# Patient Record
Sex: Male | Born: 1985 | Race: White | Hispanic: No | Marital: Married | Smoking: Never smoker
Health system: Southern US, Community
[De-identification: ages and names within clinical notes are randomized; demographics above are authoritative.]

## PROBLEM LIST (undated history)

## (undated) DIAGNOSIS — K760 Fatty (change of) liver, not elsewhere classified: Secondary | ICD-10-CM

## (undated) DIAGNOSIS — J302 Other seasonal allergic rhinitis: Secondary | ICD-10-CM

## (undated) DIAGNOSIS — J45909 Unspecified asthma, uncomplicated: Secondary | ICD-10-CM

## (undated) HISTORY — DX: Fatty (change of) liver, not elsewhere classified: K76.0

## (undated) HISTORY — DX: Unspecified asthma, uncomplicated: J45.909

---

## 2015-01-27 ENCOUNTER — Encounter: Payer: Self-pay | Admitting: Optometry

## 2015-01-27 ENCOUNTER — Ambulatory Visit: Payer: Self-pay | Admitting: Optometry

## 2015-01-27 DIAGNOSIS — H5213 Myopia, bilateral: Secondary | ICD-10-CM | POA: Insufficient documentation

## 2015-01-27 DIAGNOSIS — H16403 Unspecified corneal neovascularization, bilateral: Secondary | ICD-10-CM | POA: Insufficient documentation

## 2015-01-27 NOTE — Progress Notes (Addendum)
Outpatient Visit      Patient name: Ricky Ellison  DOB: 09/08/1986       Age: 29 y.o.  MR#: [id number]    Encounter Date: 01/27/2015    Subjective:      Chief Complaint   Patient presents with    Blurred Vision     DV NV      HPI     Blurred Vision    Additional comments: DV NV            Comments   Ricky Ellison is a 29 y.o. male present today for NP annual contact lens   exam. Pt last exam 1 yr ago, also interested in eyeglass RX  Pt currently wears Acuvue Oasys but when lenses examined they are marked   ciba (unsure of exact  lens).  -dryness, -redness, -itching, -burning, -tearing, -photophobia, -diplopia,   -headaches, -pain, -flashes, -floaters              has a current medication list which includes the following prescription(s): albuterol.     has No Known Allergies (drug, envir, food or latex).      No past medical history on file.   No past surgical history on file.     Specialty Problems        Ophthalmology Problems    Corneal neovascularization of both eyes        Myopia, bilateral               ROS     Positive for: Eyes         Objective:     Base Eye Exam     Visual Acuity (Snellen - Linear)      Right Left   Dist cc 20/20 20/20   Near cc J1+ OU         Correction:  Contacts      Pupils      Pupils APD   Right PERRLA None   Left PERRLA None         Visual Fields (Counting fingers)      Left Right   Result Full Full         Extraocular Movement      Right Left   Result Full Full         Neuro/Psych     Oriented x3:  Yes    Mood/Affect:  Normal            Additional Tests     Amsler      Right Left   Amsler Normal Normal               Slit Lamp and Fundus Exam     External Exam      Right Left    External Normal ocular adnexae, lacrimal gland & drainage, orbits Normal ocular adnexae, lacrimal gland & drainage, orbits      Slit Lamp Exam      Right Left    Lids/Lashes Normal structure & position Normal structure & position    Conjunctiva/Sclera Normal bulbar/palpebral, conjunctiva, sclera Normal  bulbar/palpebral, conjunctiva, sclera    Cornea 3mm neo 360 2mm neo 360    Anterior Chamber Clear & deep Clear & deep    Iris Normal shape, size, morphology Normal shape, size, morphology    Lens Normal cortex, nucleus, anterior/posterior capsule, clarity Normal cortex, nucleus, anterior/posterior capsule, clarity    Vitreous Clear Clear      Fundus Exam  Right Left    Disc Normal size, appearance, nerve fiber layer Normal size, appearance, nerve fiber layer    C/D Ratio 0.35 0.35    Macula Normal Normal    Vessels Normal Normal            Refraction     Manifest Refraction      Sphere Cylinder   Right -2.25 ds   Left -2.25 ds         Final Rx      Sphere Cylinder   Right -2.25 ds   Left -2.25 ds       Type:  SVL    Expiration Date:  01/28/2016            Contact Lens Exam     Current Contact Lens Rx (Trial Lens, Dispensed)      Brand Base Curve Sphere   Right [place]: Acuvue Oasys 8.40 -2.25   Left [place]: Acuvue Oasys 8.40 -2.25         Current Contact Lens Rx #2      Brand Base Curve Sphere   Right Unsure of pt's own brand, Ciba lens x6.50 marking     Left Unsure of pt's own brand, Ciba lens x6.50 marking           Contact History     AWT:  13    WTT:  6    Replacement Frequency:  Monthly    Care Regimen:  Normal      Final Contact Lens Rx      Brand Base Curve Sphere   Right [place]: Acuvue Oasys 8.40 -2.25   Left [place]: Acuvue Oasys 8.40 -2.25                 Final Rx      Sphere Cylinder   Right -2.25 ds   Left -2.25 ds       Type:  SVL    Expiration Date:  01/28/2016        Final Contact Lens Rx      Brand Base Curve Sphere   Right [place]: Acuvue Oasys 8.40 -2.25   Left [place]: Acuvue Oasys 8.40 -2.25                 No annotated images are attached to the encounter.      Assessment/Plan:      1. Myopia, bilateral     2. Corneal neovascularization of both eyes           PLAN:  1.  New spectacle Rx given today for full time wear.  Monitor yearly or sooner PRN if visual problems noted.       Successful soft CL re-fit today with Acuvue Oasys 8.4/-2.25 OD and OS.  Good fit, comfort, movement and vision.  CL trials dispensed.  Encouraged biweekly replacement and to remove nightly before bed and to avoid extended wear due to corneal neo and h/o sleeping in lenses and overwear with previous lens.  Pt to call/rtc if changes noted or will call to finalize this rx vs going back to previous lens material which patient will call to inform us of.   Undilated ocular health exam was within normal limits OU today.  The patient deferred dilation and was educated on potential ocular issues not addressed on undilated exam (peripheral retinal disease, holes, tears etc.) and to call/RTC asap if notes any flashes, new floaters or visual changes.  Otherwise monitor in 1-2 years with dilated exam.  Patient called 05/19/15 and wants to order his original lenses which were Air Optix Aqua -2.25 OU.  Will finalize rx today.  Monitor yearly.

## 2015-01-29 ENCOUNTER — Encounter: Payer: Self-pay | Admitting: Primary Care

## 2015-01-29 ENCOUNTER — Ambulatory Visit: Payer: Self-pay | Admitting: Primary Care

## 2015-01-29 VITALS — BP 106/66 | HR 84 | Ht 75.0 in | Wt 282.2 lb

## 2015-01-29 DIAGNOSIS — H6122 Impacted cerumen, left ear: Secondary | ICD-10-CM

## 2015-01-29 DIAGNOSIS — J45909 Unspecified asthma, uncomplicated: Secondary | ICD-10-CM | POA: Insufficient documentation

## 2015-01-29 DIAGNOSIS — K760 Fatty (change of) liver, not elsewhere classified: Secondary | ICD-10-CM

## 2015-01-29 DIAGNOSIS — Z23 Encounter for immunization: Secondary | ICD-10-CM

## 2015-01-29 DIAGNOSIS — T783XXA Angioneurotic edema, initial encounter: Secondary | ICD-10-CM

## 2015-01-29 DIAGNOSIS — R040 Epistaxis: Secondary | ICD-10-CM

## 2015-01-29 NOTE — Progress Notes (Signed)
SUBJECTIVE  Patient here to establish care  Prior provider was: Ricky Ellison [place]    Last CPE: 1 year  Last Flu Shot:  September 2015  HIV Testing: declined      1.  States frequent nosebleeds in the past that needed to be cauterized.  He had this done in [place], but he just started getting the nosebleeds again, last one 2 weeks ago.  States typically a seasonal thing for him.    2.  Going away to [place] and recommendations are for a Hep A vaccine.  He also states he need a Tdap.    3.  Left ear discomfort:  He wonders if he has an infection that is brewing.  States that the ear pain started 3 days ago, however today it is feeling a little better.  Has not taken anything for it. States past infections.    4.  Lip swelling last summer a couple of time cause unknown patient would like to see allergist.    5. Fatty Liver:  Patient states that his liver enzymes in past were a bit elevated and he has U/S done showing fatty liver or signs of it.    6.  Asthma:  Exercise induced, well controlled with inhaler and nebs prn.      Patient Active Problem List   Diagnosis Code    Myopia, bilateral H52.13    Corneal neovascularization of both eyes H16.403    Asthma J45.909    Fatty liver K76.0      Past Medical History   Diagnosis Date    Asthma      exercise    Fatty liver      Past Surgical History   Procedure Laterality Date    Wisdom tooth extraction       Family History   Problem Relation Age of Onset    Diabetes Mother     Diabetes Father     Hypertension Father     Diabetes Paternal Grandmother     Heart attack Paternal Grandmother     Colon cancer Paternal Grandfather     Prostate cancer Maternal Grandfather     Diabetes Maternal Grandmother     Breast cancer Maternal Grandmother     Cancer Maternal Grandmother      lymphoma    No Known Problems Brother      History     Social History    Marital Status: Married     Spouse Name: N/A     Number of Children: N/A    Years of Education: N/A          Occupational History    Not on file.     Social History Main Topics    Smoking status: Never Smoker     Smokeless tobacco: Never Used    Alcohol Use: 0.0 oz/week     0 Not specified per week      Comment: occasional    Drug Use: No    Sexual Activity:     Partners: Female     Copy: Condom     Other Topics Concern    Not on file     Social History Narrative    Lives with:  Lives with wife    Lives in:  [place]    Origin/immigration: [employer] [place]    Hobbies: hiking, craft breweries    Exercise: 6 days a week goes to gym    Diet: no special diet    Travel:  no    Occupation:  Designer, industrial/productLab Tech at [employer]    Years of Education: BA         No Known Allergies (drug, envir, food or latex)    Review of Systems   (Positive items noted in bold, otherwise listed items are negative)  General: fatigue, weight change, loss of appetite, fever, night sweats, weakness  ENT: hearing difficulty, ear ringing or pain, sinus problems, nasal congestion, nosebleeds, oral lesions, dental problems, throat pain  CV: irregular heartbeat, racing heart, chest pain, leg swelling, leg pain with walking, DOE  Resp: shortness of breath, prolonged or productive cough, wheezing, pleuritic pain  GI: dysphagia, heartburn, nausea, vomiting, constipation, diarrhea, abdominal pain, blood in stool, melena, changes in bowel habits  GU: dysuria, urinary frequency or urgency, urinary incontinence, nocturia, impotence  MSK: joint pain, joint swelling, myalgias, muscle weakness  Derm: rash, itching, new or changing skin lesions, hair loss or change  Neuro: headache, vision changes, numbness, parasthesias, balance or gait difficulty, dizziness, tremor, uncontrolled movements  Psych: insomnia, irritability, depression, anxiety, mood swings, frequent anger, hallucinations  Endocrine: heat or cold intolerance, polydipsia, libido changes  Heme: easy bruising or bleeding, unexplained swelling  Allergy and Immunology: itchy or watery eyes or  nose, frequent infections Angioedema    Current Outpatient Prescriptions   Medication Sig    albuterol HFA 108 (90 BASE) MCG/ACT inhaler Inhale 2 puffs into the lungs every 6 hours as needed for Wheezing   Shake well before each use.    albuterol (PROVENTIL) (5 MG/ML) 0.5% nebulizer solution Take 2.5 mg by nebulization every 6 hours as needed for Wheezing     OBJECTIVE  BP 106/66 mmHg   Pulse 84   Ht 1.905 m (6\' 3" )   Wt 128.005 kg (282 lb 3.2 oz)   BMI 35.27 kg/m2  General Appearance:    Alert, cooperative, no distress, appears stated age   Head:    Normocephalic   Ears:    Right ear normal some cerumen in ear but not impacted.  Left ear + impaction noted cannot visualize the TM.  After flush of ear the TM is clear no infection.   Oropharynx:   Buccal mucosa normal; good dentition; oropharynx clear       Lungs:     Clear to auscultation bilaterally without W/R/R, unlabored respiratory effort    Heart:    Regular rate and rhythm, S1 and S2 normal, no murmur, rub or gallop                       Neurologic:   A&O x 3, normal gait   Psychiatric:   Normal mood and affect     ASSESSMENT & PLAN  1. Asthma, unspecified asthma severity, uncomplicated  -continue albuterol and nebs PRN    2. Fatty liver  Hepatic functions    3. Frequent nosebleeds  ORDERED  - AMB REFERRAL TO ENT    4. Angioedema, initial encounter  ORDERED  - AMB REFERRAL TO ALLERGY/IMMUNOLOGY    5. Cerumen impaction, left  Ear flush today.    6. Vaccines given today:  Hep A and Tdap     Return for Yearly Check-up sometime within the year Dr. Michael Ellison. Return to office sooner prn.      Ricky Ellison, [place]

## 2015-02-09 ENCOUNTER — Telehealth: Payer: Self-pay | Admitting: Pediatric Allergy

## 2015-02-09 NOTE — Telephone Encounter (Signed)
Mr. Ricky Ellison is scheduled for an NPV on:    Date: 03/08/2015  Provider: Dr. Christia ReadingSlack  Symptoms: angioedema, several allergic reactions without known cause    This appointment is over 2 weeks out.  Does the patient want a sooner appointment if available? yes  Is the patient ok with waiting until the date scheduled to be seen? yes    The patient can be reached at [phone] if necessary.

## 2015-02-16 LAB — LIPID PANEL
Cholesterol: 105
Glucose: 88
HDL: 37
LDL Calculated: 54
Non HDL Cholesterol: 68
Triglycerides: 72

## 2015-02-17 ENCOUNTER — Encounter: Payer: Self-pay | Admitting: Gastroenterology

## 2015-02-18 ENCOUNTER — Encounter: Payer: Self-pay | Admitting: Primary Care

## 2015-03-03 ENCOUNTER — Encounter: Payer: Self-pay | Admitting: Gastroenterology

## 2015-03-08 ENCOUNTER — Ambulatory Visit: Payer: Self-pay | Admitting: Pediatric Allergy

## 2015-03-08 ENCOUNTER — Ambulatory Visit: Payer: Self-pay

## 2015-03-08 ENCOUNTER — Encounter: Payer: Self-pay | Admitting: Pediatric Allergy

## 2015-03-08 VITALS — BP 121/59 | HR 72 | Ht 76.0 in | Wt 272.0 lb

## 2015-03-08 DIAGNOSIS — L509 Urticaria, unspecified: Secondary | ICD-10-CM

## 2015-03-08 DIAGNOSIS — J301 Allergic rhinitis due to pollen: Secondary | ICD-10-CM

## 2015-03-08 DIAGNOSIS — T783XXA Angioneurotic edema, initial encounter: Secondary | ICD-10-CM

## 2015-03-08 DIAGNOSIS — J4520 Mild intermittent asthma, uncomplicated: Secondary | ICD-10-CM

## 2015-03-08 MED ORDER — TRIAMCINOLONE ACETONIDE 55 MCG/ACT NA AERO *I*
2.0000 | INHALATION_SPRAY | Freq: Every day | NASAL | Status: DC
Start: 2015-03-08 — End: 2015-07-14

## 2015-03-08 NOTE — Patient Instructions (Addendum)
1. Start Nasacort 2 sprays on each side once daily with the technique reviewed  2. Restart Zyrtec once daily, 1-2 times daily as needed. Use this daily during the pollen seasons  3. Consider allergy shots  4. keep auto-injectable epinephrine readily available and use as needed for severe allergic reaction.  5. If you have another episode of swelling    Follow up in 4-5 months    1.  Environmental Control or modification:  Dust mite control.         1.  Dust mite proof covers/encasing on the mattress, pillow and box spring.         2.  Wash sheets and bed linens in hot water each week.       3.  Vacuum carpets in bedroom each week.       4.  Dust or wipe down any hard surfaces.

## 2015-03-08 NOTE — Progress Notes (Signed)
Thank you for referring Ricky Ellison to me for evaluation. Please see my note below for details of our visit.    As you know Ricky Ellison is a 29 y.o. male who has a  has a past medical history of Asthma and Fatty liver.Marland Kitchen He presents today for evaluation of allergic reaction, nasal drainage, asthma..    Chief Complaint   Patient presents with    Angioedema    Allergic Rhinitis        HPI:  Ricky Ellison that last summer2015 he had an allergic reaction. He note that he was sitting outside at a brewery. He started having itching under his arms and felt flush. He had watery, red eyes. He had tongue swelling and mouth swelling. He complained of a rash that was all over on his chest and arms. No new exposures. No illnesses around that time. No NSAID exposure as far as he knows. He was given benadryl and another Histamine blocker in the ED. He was monitored for a couple hours and then he was discharged. It took about 2 hours for his symptoms to resolve. He has had hives previously but no swelling. He has allergy to Deoderants and laundry detergents so only uses the brand he does not have symptoms with. No swelling of the airway with these episode.    This summer again he had a reaction and got ahead of his symptoms and took 4 benadryl and stopped the reaction. He Ellison that he was sitting on the back porch. He had itching and watery eyes. He went inside. He felt that his lips were swelling and he took some benadryl. It took about 1-2 hours for symptoms to resolve. He does not recall having hives at the time. He Ellison that there were no airway symptoms. No vomiting with these episodes. He does not recall a trigger to these episodes. No similar foods for either episode. He eats all foods that he has eaten other times. No joint symptoms. No intolerance to temperature changes. No family history of thyroid issues or swelling.     He Ellison that he has started having post nasal drainage on the left nostril dripping in the  front. He gets nose bleeds frequently. This is worse in the spring months. His symptoms are present year round as well. He Ellison itchy dry throat in the summer months. He does take Zyrtec once daily during the pollen season. This helps at times, no sedation. He has not tried any nose sprays. Never had allergy testing previously or allergy shots.     He feels that his asthma is well controlled.   He uses a rescue inhaler for his asthma.   EIB is his main symptom.   He does pretreat prior to exercise.   He has not previously been on a maintenance inhaler in the past.   He Ellison that he has been on Advair for 1 month about 1.5 yrs ago.  He has never been hospitalized for his breathing  Every once in a while he has symptoms with pollen seasons and the warmer weather.    He denies any symptoms recently.    He does have breathing symptoms (wheezing) with uri's.  His cough does tend to last more than 7 days.  Being around smoke triggers symptoms.    Patient's medications, allergies, past medical, surgical, social and family histories were reviewed and updated as appropriate.    There is no family or personal history of asthma, allergies, immune deficiency, cystic  fibrosis, or recurrent infections.    Social History  He  reports that he has never smoked. He has never used smokeless tobacco. He reports that he drinks alcohol. He reports that he does not use illicit drugs.     Environmental History  He lives in a apartment.    Pets in the house: dog.   There is not a basement in the home.   There is not mold or moisture in the home.  The bedroom has carpet as flooring.    There are not dust mite allergen covers on the bed.    There are any down or feather pillows, comforters, etc.    The home has central ac, forced air heating.   There is not cigarette exposure in the home.    She does not note triggers of non-allergic rhinitis including: smoke, perfumes, strong smells, cold air, aerosols, solvents, house hold cleansers.    There is not history of systemic allergic reaction to insect bite  Occupation: he  works as a Research scientist (medical), Landscape architect.    Review of Systems   Constitutional: Negative for chills and fever.   HENT: Negative for nosebleeds and sore throat.    Eyes: Negative for blurred vision and double vision.   Respiratory: Negative for cough, shortness of breath and wheezing.    Cardiovascular: Negative for chest pain, palpitations and leg swelling.   Gastrointestinal: Negative for abdominal pain, blood in stool, diarrhea, nausea and vomiting.   Musculoskeletal: Negative for joint pain and myalgias.   Neurological: Negative for dizziness, focal weakness, weakness and headaches.   Endo/Heme/Allergies: Does not bruise/bleed easily.         Physical Exam   Constitutional: He is oriented to person, place, and time and well-developed, well-nourished, and in no distress. No distress.   HENT:   Head: Normocephalic and atraumatic.   Right Ear: Tympanic membrane, external ear and ear canal normal. No drainage or tenderness. Tympanic membrane is not injected, not scarred, not perforated, not erythematous and not retracted.   Left Ear: Tympanic membrane, external ear and ear canal normal. No drainage or tenderness. Tympanic membrane is not injected, not scarred, not perforated, not erythematous and not retracted.   Nose: Mucosal edema (pale boggy nasal mucosa without obstruction or nasal polyps) and rhinorrhea (clear) present.   Mouth/Throat: Uvula is midline, oropharynx is clear and moist and mucous membranes are normal.   Eyes: Conjunctivae and EOM are normal. Pupils are equal, round, and reactive to light.   Neck: Normal range of motion. Neck supple.   Cardiovascular: Normal rate and regular rhythm.    No murmur heard.  Pulmonary/Chest: Effort normal and breath sounds normal. He has no wheezes. He has no rales.   Abdominal: Soft. Bowel sounds are normal.   Musculoskeletal: Normal range of motion. He exhibits no edema.    Neurological: He is alert and oriented to person, place, and time.   Skin: Skin is warm and dry. No rash noted. He is not diaphoretic. No erythema.   Psychiatric: Affect normal.   Vitals reviewed.      Allergy Testing 03/09/2015   Type of testing preformed: Percutaneous   (+)   Histamine     10mg /ml     HS 5/10   (-)   0.9% NaCl/   0.03%Alb/   0.4% Phenol 0/0   Cat      10,000BAU     HS 0/0   Dog     1:20  HS 0/0   Mix Feathers     1:10    HS 0/0   Mouse    1:20    G 0/0   D.f. Mite     10,000AU     G 6/10   D.p. Mite     10,000AU     G 5/10   Roach     1:20     G 0/3   Pine, white/Eastern     1:20     G 0/0   Rolette, Amer.     1:20     HS 0/0   Poplar, cottonwood     1:20     G 8/15   Sugar Maple     1:20     G 7/7   Oak, white     1:20     G 6/22   Ash, white     1:20     G 8/24   Cedar, red     1:20     G 2/3   Moab, black     1:20     G 5/8   Charletta Cousin, red/river     1:20     G 8/23   Hickory, shagbark     1:20     G /25   Black Walnut     1:20     G 7/16   Sycamore     1:20     G 7/19   Grass Mix # 5     100,000BAU     G 8/20   Johnson Grass     1:20     G 7/8   Sheep Sorrel     1:20     G 10/12   Plantain     1:20     G 8/13   Smooth Brome     1:20     G 9/18   Yellow Dock     1:20     G 5/16   Amaranths     1:20     G 6/17   Artemesia     1:20     G 6/25   Chenopodium     1:20     G 5/20   Cocklebur     1:20     G 4/16   Kochia     1:20     G 9/17   Ragweed     1:20     G 7/18   Alternaria Alternata   1:10    HS 0/0   Bipolaris Sorokiniana   1:10    HS 0/0   Hormodendrum Cladospor.   1:10    HS 0/0   Aspergillus Mix   1:10    HS 0/0   Penicillium Mix   1:10    HS 0/0     Asthma ACT Scores - Review 03/08/2015   In the past 4 weeks, how much of the time did your asthma keep you from getting as much done at work, school, or home? 5-None of the time   During the past 4 weeks, how often have you had shortness of breath? 3-3 to 6 times a week   During the past 4 weeks, how often did your asthma symptoms wake you up at  night or earlier than usual in the morning? 5-not at all   During the past 4 weeks, how often have you used your rescue inhaler or nebulizer medication(such as albuterol)? 3-2 or 3  times per week   How would you rate your asthma control during the past 4 weeks? 4-Well controlled   ACT SCORE 12 and OLDER 20     Assessment and Plan:   1. Asthma, mild intermittent, uncomplicated  PULMONARY FUNCTION TEST Spirometry with Bronchodilator, Exhaled Nitric Oxide   2. Allergic rhinitis due to pollen     3. Angioedema, initial encounter           1. Angioedema and urticaria:  Ricky Ellison acute onset of symptoms of angioedema and urticaria while sitting outside along with his positive testing to her mental allergens is suggestive that she may have had a heightened response to the pollen.  As he has sensitization to multiple allergens we discussed allergen immunotherapy as a good option for him.  We discussed that another consideration would be idiopathic urticaria and angioedema. Urticaria and Angioedema/swelling can be caused by a variety of factors, and is most commonly the result of mast cell-mediated processes and both are most comonly Idiopathic in origin. Medications are common causes of urticaria and angioedema, therefore asked him to limit his use of NSAIDs.  Viral infections or stress can also trigger episodes.  Ricky Ellison does not have any symptoms suggestive of autoimmune disease or thyroid disease of the underlying cause of his symptoms.  If his symptoms do recur would be reasonable to obtain a baseline CBC and tryptase level but as he has had these 2 isolated episodes with similar characteristics we will hold off at this time.  I've instructed that if he should have recurrence he would benefit from starting aggressive treatment with antihistamines (twice daily) and if symptoms are not able to be well controlled the addition of singulair can be considered. As the swelling has involved the airway I recommended that he keep  auto-injectable epinephrine readily available and use as needed for severe allergic reaction.     2.  Asthma mild intermittent: Currently been symptoms are well controlled with as needed albuterol and pretreatment prior to exercise.  I have encouraged him to continue this regimen and we will obtain baseline lung function testing upon follow-up in our clinic.     3. Allergic rhinitis: Ricky Ellison was found to have significant sensitivity to trees, grasses, weeds, dust mite on his aeroallergen skin prick testing today.  We discussed treatments for allergic rhinitis to include avoidance, medications, and allergen immunotherapy.  We discussed interventions for dust mite control, and he was provided with information on dust mite avoidance measures.  It would be reasonable to start with medication therapy to include intranasal steroid (Nasacort 2 sprays on each side once daily) and oral antihistamine  once daily. We reviewed proper technique for use of a nose spray.  If he does not achieve adequate control with medications and/or would like to decrease overall medication use, allergen immunotherapy would be an option to consider.       Recommendations:  Orders Placed This Encounter   Procedures    PULMONARY FUNCTION TEST Spirometry with Bronchodilator, Exhaled Nitric Oxide       Ricky Ellison Ellison [facility] in 4-5 months for follow-up evaluation.      Mr. Ricky Ellison has received take home instructions and/or educational material today.      My apologies for the delay in cine my note.Thank you for the opportunity to participate in the care of your delightful patient,  Ricky Ellison.  Should there be any questions or concerns,  please do not hesitate to contact me.    Sincerely,    Antony Salmon. Christia Reading, MD, MS  Assistant Professor of Medicine and Pediatrics  Allergy and Immunology  Division of Allergy, Immunology, and Rheumatology  [facility] of  [facility] and [facility]

## 2015-03-10 ENCOUNTER — Encounter: Payer: Self-pay | Admitting: Otolaryngology

## 2015-03-10 ENCOUNTER — Ambulatory Visit: Payer: Self-pay | Admitting: Otolaryngology

## 2015-03-10 VITALS — BP 118/59 | HR 64 | Ht 76.0 in | Wt 269.0 lb

## 2015-03-10 DIAGNOSIS — R040 Epistaxis: Secondary | ICD-10-CM | POA: Insufficient documentation

## 2015-03-10 NOTE — Procedures (Signed)
PROCEDURE  Procedure name: Silver Nitrate Cautery  Indication: right sided recurrent epistaxis  Consent: I obtained consent from the patient prior to performing the procedure.  Pre-procedure: He was seated for the procedure. His right nostril mucosa was anaesthetized with tetracaine and phenylephrine with appropriate technique. After adequate anaesthesia I began the procedure.  Procedure: I used a cotton swab infused with silver nitrate to cauterize the prominent vasculature using appropriate technique. I applied a small amount of topical antibiotic to the small ulceration left after the cautery procedure. I instructed him to keep the ulceration moist with water-based lubricant jelly.  Post-procedure: The patient has no complaints post-procedure. He will follow up in as needed.       Attestations:  Documented by Jackie Plumanielle Somalia Segler acting as a scribe for Dr. Zenovia JarredJohn Wayman 03/10/2015 8:12 AM    All medical record entries made by the Scribe were at my direction and personally dictated by me. I have reviewed the chart and agree that the record accurately reflects my personal performance of the history, physical exam, assessment and plan. I have also personally directed, reviewed, and agree with the discharge instructions. 03/10/2015 8:12 AM

## 2015-03-10 NOTE — Progress Notes (Signed)
Patient seen on 03/10/2015 at 8:05 AM     HPI  CC: nosebleeds  Treatment to date: right cauterization    Context: Ricky BeechBenjamin Ellison is a 29 y.o. male who presents for new evaluation of nosebleeds on and off for many years. He reports that they occur mostly in the spring and fall which is when he has seasonal allergies. A little over a year ago he had the right side of his nose cauterized. Prior to this he was having 4-5 nosebleeds per week that would take a long time to stop, but after the cauterization they decreased to about twice monthly. Now he has nosebleeds equally in both sides of his nose, most recently from the right side. In the past few weeks he has had some post-nasal drip, and he has been using nasal saline rinses almost daily and also puts petroleum jelly in his nose when his nose feels dry. He recently got a prescription for Nasocort nasal spray to reduce allergic inflammation in his nose, but he has not picked up this prescription yet. The patient denies any other bleeding issues and is not on any anti-coagulative medications. He has no other complaints today.    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    HISTORY  Patient's past medical and surgical history, medications, allergies, and social history were reviewed. Pertinent history has been included in the HPI.    REVIEW OF SYSTEMS  Review of Systems   Constitutional: Negative for fever and chills.   HENT: Positive for congestion and nosebleeds. Negative for ear discharge, ear pain, hearing loss and tinnitus.    Eyes: Negative.    Respiratory: Negative for cough and shortness of breath.    Cardiovascular: Negative.    Gastrointestinal: Negative for nausea and vomiting.   Genitourinary: Negative.    Musculoskeletal: Negative.    Skin: Negative for itching and rash.   Neurological: Negative.    Endo/Heme/Allergies: Negative.          PHYSICAL EXAM  Filed Vitals:    03/10/15 0757   BP: 118/59   Pulse: 64   Height:  1.93 m (6\' 4" )   Weight: 122.018 kg (269 lb)        Constitutional Exam: He is pleasant and cooperative.  Well-developed and well-nourished.  No apparent distress.  Vital signs reviewed in electronic record.    Exam of Ears   Inspection:  Right: Normal pinna. Normal canal. Healthy, intact TM. Left: Normal pinna. Normal canal. Healthy, intact TM.     Palpation: No mastoid tenderness.     Exam of Nose:   Inspection: Septum fairly midline. Normal turbinates. No polyps or lesions. There is a prominent vessel on the right septum.    Exam of Oral Cavity/Oropharynx:   Inspection: Normal dental wear for age. Normal appearing mucosal surfaces. Tongue midline. No lesions. Normal uvula and soft palate. Uvula midline. Normal oropharynx.     Exam of Neck:   Inspection: No gross masses. Supple. Symmetric in appearance.   Palpation: No lymphadenopathy. No masses. No thyromegaly. Salivary glands soft.    Neurological Exam:  Normal speech. Facial nerve function intact.      ASSESSMENT AND PLAN  1. Epistaxis, recurrent          Ricky BeechBenjamin Ellison is a 29 y.o. male who presented for new evaluation of nosebleeds  I cauterized a prominent vessel in the right side of his nose with silver nitrate today. The patient  tolerated this well.  We discussed avoiding strenuous activity and nose blowing for the next two weeks while the cauterized area heals. I demonstrated how to stop the bleeding with compression if he has a nosebleed during this time. If he has any bleeding beyond two weeks we may need to repeat the cauterization. The patient is in agreement with this plan  I advised the patient to use a water-based ointment in his nose to keep his nose moist and gave him a sample of this today. He should also continue with nasal saline irrigation.  I advised him against using the Nasocort steroid spray as nosebleeds are one of the main side effects of this medication. He could try an OTC antihistamine for his allergies instead.   I invited  and answered all questions to the patient's satisfaction   He will follow up in 3-4 weeks          Attestations:  Documented by Jackie Plum acting as a scribe for Dr. Zenovia Jarred 03/10/2015 8:12 AM     All medical record entries made by the Scribe were at my direction and personally dictated by me. I have reviewed the chart and agree that the record accurately reflects my personal performance of the history, physical exam, assessment and plan. I have also personally directed, reviewed, and agree with the discharge instructions. 03/10/2015 8:12 AM      I hope this information is useful to you in the ongoing care of your patient. Please feel free to call on me if you have any questions or concerns. Thank you for the opportunity to participate in this patient's care.    With best personal regards,      Emilio Aspen, M.D.  Associate Professor  Otology/Neurotology  Department of Otolaryngology    Dictated with voice recognition software, not read.

## 2015-03-15 ENCOUNTER — Encounter: Payer: Self-pay | Admitting: Primary Care

## 2015-03-15 DIAGNOSIS — J45909 Unspecified asthma, uncomplicated: Secondary | ICD-10-CM

## 2015-03-15 MED ORDER — ALBUTEROL SULFATE HFA 108 (90 BASE) MCG/ACT IN AERS *I*
2.0000 | INHALATION_SPRAY | Freq: Four times a day (QID) | RESPIRATORY_TRACT | Status: DC | PRN
Start: 2015-03-15 — End: 2015-06-08

## 2015-04-05 ENCOUNTER — Encounter: Payer: Self-pay | Admitting: Pediatric Allergy

## 2015-04-05 MED ORDER — EPINEPHRINE 0.3 MG/0.3ML IJ SOAJ *I*
0.3000 mg | Freq: Once | INTRAMUSCULAR | Status: AC
Start: 2015-04-05 — End: 2015-04-05

## 2015-04-05 NOTE — Telephone Encounter (Signed)
Ok for EpiPen per Dr. Karren BurlySlack's note.  Patient can have teaching done at pharmacy or at our office.  He should be comfortable with administration prior to trip.  I sent Rx.

## 2015-04-05 NOTE — Telephone Encounter (Signed)
See Dr. Karren BurlySlack's note 03/08/2015    Will forward to Dr. Clyda GreenerLomas.

## 2015-04-07 ENCOUNTER — Telehealth: Payer: Self-pay | Admitting: Pediatric Allergy

## 2015-04-07 ENCOUNTER — Ambulatory Visit: Payer: Self-pay

## 2015-04-07 DIAGNOSIS — Z9109 Other allergy status, other than to drugs and biological substances: Secondary | ICD-10-CM

## 2015-04-07 MED ORDER — EPINEPHRINE 0.3 MG/0.3ML IJ SOAJ *I*
0.3000 mg | INTRAMUSCULAR | Status: DC | PRN
Start: 2015-04-07 — End: 2016-05-15

## 2015-04-07 NOTE — Telephone Encounter (Signed)
Ricky Ellison called back and he will be picking up his epi pen today.   He will stop by between 1:30 pm and 2:30 pm for epi pen teaching.     Will have Dr. Christia ReadingSlack reorder the epi pen as there was an error with the first order

## 2015-04-07 NOTE — Patient Instructions (Signed)
Epinephrine Auto-injectors    The Basics   Written by the doctors and editors at UpToDate   What is an epinephrine auto-injector? -- An epinephrine auto-injector is a device that gives a person a shot of a medicine called “epinephrine.” Epinephrine is used to treat a severe allergic reaction, also called “anaphylaxis.” A severe allergic reaction can happen after a person eats a food, takes a medicine, or is stung by an insect that he or she is allergic to.   What are the symptoms of an allergic reaction? -- Symptoms of an allergic reaction can include:  · Hives - These are raised, red patches of skin that are very itchy (picture 1).   · Puffiness of the face, eyelids, ears, mouth, hands, or feet  · Redness or itching of the skin (without hives)  · Runny nose or swelling of the tongue  · Wheezing or trouble breathing   · Vomiting or diarrhea  · Feeling dizzy or passing out  · Death (although this is rare)  When should I use my epinephrine auto-injector? -- Your doctor or nurse will tell you when you should use your auto-injector. Most doctors recommend that you use 1 right away when you have an allergic reaction and:  · Have trouble breathing or feel like your throat is tight  · Feel dizzy or like you are going to pass out  If your doctor prescribed an auto-injector for your child, use it right away when your child has an allergic reaction and:  · Seems groggy or passes out  · Vomits a lot  · Coughs a lot  · Has hives all over his or her body  How do I use an epinephrine auto-injector? -- Your auto-injector will have directions that come with it. In general, you:  · Take the auto-injector out of its case  · Hold the auto-injector in your hand, making sure that your fingers aren’t over either end  · Take off the safety cap or caps  · Press the tip firmly into the outer thigh - The needle in the auto-injector can go through clothes (picture 2).  · Hold it in place and count to 10  After you use an auto-injector,  call 9-1-1 or go to the hospital, because your symptoms can come back as the medicine wears off. In the hospital, doctors can give you other medicines and make sure that your symptoms don’t return.  What if the auto-injector doesn’t stop my symptoms? -- The medicine in the auto-injector should stop your symptoms within a few minutes. But if your symptoms don’t improve, get worse, or return before you reach the hospital, you will need another dose.  Some auto-injectors have 2 doses of medicine in them, but others have only 1 dose. If your auto-injector has only 1 dose of medicine, you will need to use a second auto-injector.  Where should I keep my auto-injector? -- Keep your auto-injector at room temperature -- and not in places that get too hot or cold.   You should carry at least 1 auto-injector with you at all times, because you never know when you’ll need it. Let your family members know where you keep your auto-injector. That way, if you have an allergic reaction but can’t get your auto-injector, another person can get it and use it on you.  If your child has an auto-injector, be sure to bring it wherever he or she goes. You should also keep 1 at your child’s school (if he or she goes to school).   What are the side effects   of epinephrine? -- Epinephrine can cause short-term side effects, such as:  · Fast or pounding heartbeat  · Feeling nervous, shaky, or dizzy  · Headache  All topics are updated as new evidence becomes available and our peer review process is complete.  This topic retrieved from UpToDate on: Mar 25, 2014.  Topic 15917 Version 3.0  Release: 23.3 - C23.74  © 2015 [employer]ToDate, Inc. All rights reserved.  picture 1: Hives     Hives are raised, red patches of skin that are usually very itchy. They usually come and go within a few hours. But they can show up again and again in some people.  Graphic 79242 Version 5.0  picture 2: Using an epinephrine autoinjector     This photo shows how to use an  epinephrine autoinjector. When using an autoinjector, hold it in your fist, with your fingers away from either end. Press it firmly into your outer thigh and hold it in place for 10 seconds. It is best to sit down and if you feel dizzy, to lie down. If you pass out, another person should lower you to the ground and raise your legs up to help blood flow to your heart and brain.  Graphic 75436 Version 8.0  Consumer Information Use and Disclaimer   This information is not specific medical advice and does not replace information you receive from your health care provider. This is only a brief summary of general information. It does NOT include all information about conditions, illnesses, injuries, tests, procedures, treatments, therapies, discharge instructions or life-style choices that may apply to you. You must talk with your health care provider for complete information about your health and treatment options. This information should not be used to decide whether or not to accept your health care provider's advice, instructions or recommendations. Only your health care provider has the knowledge and training to provide advice that is right for you.The use of UpToDate content is governed by the UpToDate Terms of Use. ©2015 [employer]Date, Inc. All rights reserved.  Copyright   © 2015 [employer]Date, Inc. All rights reserved.        Epinephrine Auto-injectors    The Basics   Written by the doctors and editors at UpToDate   What is an epinephrine auto-injector? -- An epinephrine auto-injector is a device that gives a person a shot of a medicine called “epinephrine.” Epinephrine is used to treat a severe allergic reaction, also called “anaphylaxis.” A severe allergic reaction can happen after a person eats a food, takes a medicine, or is stung by an insect that he or she is allergic to.   What are the symptoms of an allergic reaction? -- Symptoms of an allergic reaction can include:  · Hives - These are raised, red patches of skin  that are very itchy (picture 1).   · Puffiness of the face, eyelids, ears, mouth, hands, or feet  · Redness or itching of the skin (without hives)  · Runny nose or swelling of the tongue  · Wheezing or trouble breathing   · Vomiting or diarrhea  · Feeling dizzy or passing out  · Death (although this is rare)  When should I use my epinephrine auto-injector? -- Your doctor or nurse will tell you when you should use your auto-injector. Most doctors recommend that you use 1 right away when you have an allergic reaction and:  · Have trouble breathing or feel like your throat is tight  · Feel dizzy or like you are going to pass out  If your doctor prescribed an auto-injector for your child,   use it right away when your child has an allergic reaction and:  · Seems groggy or passes out  · Vomits a lot  · Coughs a lot  · Has hives all over his or her body  How do I use an epinephrine auto-injector? -- Your auto-injector will have directions that come with it. In general, you:  · Take the auto-injector out of its case  · Hold the auto-injector in your hand, making sure that your fingers aren’t over either end  · Take off the safety cap or caps  · Press the tip firmly into the outer thigh - The needle in the auto-injector can go through clothes (picture 2).  · Hold it in place and count to 10  After you use an auto-injector, call 9-1-1 or go to the hospital, because your symptoms can come back as the medicine wears off. In the hospital, doctors can give you other medicines and make sure that your symptoms don’t return.  What if the auto-injector doesn’t stop my symptoms? -- The medicine in the auto-injector should stop your symptoms within a few minutes. But if your symptoms don’t improve, get worse, or return before you reach the hospital, you will need another dose.  Some auto-injectors have 2 doses of medicine in them, but others have only 1 dose. If your auto-injector has only 1 dose of medicine, you will need to use a  second auto-injector.  Where should I keep my auto-injector? -- Keep your auto-injector at room temperature -- and not in places that get too hot or cold.   You should carry at least 1 auto-injector with you at all times, because you never know when you’ll need it. Let your family members know where you keep your auto-injector. That way, if you have an allergic reaction but can’t get your auto-injector, another person can get it and use it on you.  If your child has an auto-injector, be sure to bring it wherever he or she goes. You should also keep 1 at your child’s school (if he or she goes to school).   What are the side effects of epinephrine? -- Epinephrine can cause short-term side effects, such as:  · Fast or pounding heartbeat  · Feeling nervous, shaky, or dizzy  · Headache  All topics are updated as new evidence becomes available and our peer review process is complete.  This topic retrieved from UpToDate on: Mar 25, 2014.  Topic 15917 Version 3.0  Release: 23.3 - C23.74  © 2015 UpToDate, Inc. All rights reserved.  picture 1: Hives     Hives are raised, red patches of skin that are usually very itchy. They usually come and go within a few hours. But they can show up again and again in some people.  Graphic 79242 Version 5.0  picture 2: Using an epinephrine autoinjector     This photo shows how to use an epinephrine autoinjector. When using an autoinjector, hold it in your fist, with your fingers away from either end. Press it firmly into your outer thigh and hold it in place for 10 seconds. It is best to sit down and if you feel dizzy, to lie down. If you pass out, another person should lower you to the ground and raise your legs up to help blood flow to your heart and brain.  Graphic 75436 Version 8.0  Consumer Information Use and Disclaimer   This information is not specific medical advice and does not replace information you receive from your health care provider. This is only a brief summary   of general  information. It does NOT include all information about conditions, illnesses, injuries, tests, procedures, treatments, therapies, discharge instructions or life-style choices that may apply to you. You must talk with your health care provider for complete information about your health and treatment options. This information should not be used to decide whether or not to accept your health care provider's advice, instructions or recommendations. Only your health care provider has the knowledge and training to provide advice that is right for you.The use of UpToDate content is governed by the UpToDate Terms of Use. ©2015 UpToDate, Inc. All rights reserved.  Copyright   © 2015 UpToDate, Inc. All rights reserved.

## 2015-04-07 NOTE — Telephone Encounter (Deleted)
If he is having swel

## 2015-04-07 NOTE — Telephone Encounter (Signed)
Patient called returning a call from [facility].  Patient states that its regarding the usage/possible side effects of his epi pen.  Writer offered to schedule IST appointment, but patient declined.  Patient states he is requesting a call back from [facility].  Patient can be reached @ [phone]

## 2015-04-07 NOTE — Progress Notes (Signed)
Epinephrine auto injector teaching.   Pt came in today for Epi pen teaching.  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   Reviewed the symptoms on which he would use the epipen: throat closure and difficulty breathing.   Pt shown the expiration date of epi pen and advised to call the office for a refill as needed.  Discussed importance of carrying the 2 pack of Epi pen with self at all times.  Advised that if 1 dose does not relieve symptoms, he would need another dose.   Discussed and demonstrated how to activate and administer the Epi pen.  Pt verbalized understanding, voiced appropriate questions and was able to do a return demonstration.    Reinforced the importance of calling 911 after he administers the Epi pen as further monitoring in a hospital setting would be required  Pt given written instructions to review at home via the AVS.

## 2015-04-07 NOTE — Telephone Encounter (Addendum)
If Ricky Ellison is having recurrence of his episodes of swelling at times he should increase his antihistamine (Zyrtec) 2 twice daily.

## 2015-04-07 NOTE — Telephone Encounter (Signed)
Pt came in today for Epi pen teaching.  He reports he did have another episode of itching and hives at his chest that went up to his throat.   He took 3 Benadryl and the symptoms resolved.      Discussed that Dr. Christia ReadingSlack had recommended that he take the Zyrtec twice daily.   He says he has switched to Allegra once daily which seems to be more effective than the Zyrtec.      Will check with Dr. Christia ReadingSlack if she wants him to take the Allegra twice a day.   Pt states he is available by Mychart as needed.

## 2015-04-08 NOTE — Telephone Encounter (Signed)
Yes Allegra twice daily is fine. Mychart message sent.

## 2015-04-12 ENCOUNTER — Ambulatory Visit: Payer: Self-pay | Admitting: Otolaryngology

## 2015-04-12 ENCOUNTER — Encounter: Payer: Self-pay | Admitting: Otolaryngology

## 2015-04-12 VITALS — BP 112/71 | HR 67 | Ht 76.0 in | Wt 266.0 lb

## 2015-04-12 DIAGNOSIS — R040 Epistaxis: Secondary | ICD-10-CM

## 2015-04-12 NOTE — Progress Notes (Signed)
Patient seen on 04/12/2015 at 12:38 PM     HPI  CC: Follow-up from cauterization of right nasal septum  Treatment to date: right cauterization    Context: Ricky Ellison is a [age] y.o. male who presents for new evaluation of nosebleeds on and off for many years. He reports that they occur mostly in the spring and fall which is when he has seasonal allergies. He underwent silaver nitrate cautery in April and since has had not further bleeding.  He continues to use nasal saline irrigations BID at home.  He  Also states that he left ear popped last night, however it is feeling better today.    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    HISTORY  Patient's past medical and surgical history, medications, allergies, and social history were reviewed. Pertinent history has been included in the HPI.    REVIEW OF SYSTEMS  Review of Systems   Constitutional: Negative for fever and chills.   HENT: Positive for congestion and nosebleeds. Negative for ear discharge, ear pain, hearing loss and tinnitus.    Eyes: Negative.    Respiratory: Negative for cough and shortness of breath.    Cardiovascular: Negative.    Gastrointestinal: Negative for nausea and vomiting.   Genitourinary: Negative.    Musculoskeletal: Negative.    Skin: Negative for itching and rash.   Neurological: Negative.    Endo/Heme/Allergies: Negative.          PHYSICAL EXAM  Filed Vitals:    04/12/15 1212   BP: 112/71   Pulse: 67   Height: 193 cm (6\' 4" )   Weight: 120.657 kg (266 lb)        Constitutional Exam: He is pleasant and cooperative.  Well-developed and well-nourished.  No apparent distress.  Vital signs reviewed in electronic record.    Exam of Ears   Inspection:  Right: Normal pinna. Normal canal. Healthy, intact TM. Left: Normal pinna. Normal canal. Healthy, intact TM.     Palpation: No mastoid tenderness.     Exam of Nose:   Inspection: Septum fairly midline. Normal turbinates. No polyps or lesions. Area of mild  desiccation and erythema near previous cautery site, no prominent vessels on right septum.    Exam of Oral Cavity/Oropharynx:   Inspection: Normal dental wear for age. Normal appearing mucosal surfaces. Tongue midline. No lesions. Normal uvula and soft palate. Uvula midline. Normal oropharynx.     Exam of Neck:   Inspection: No gross masses. Supple. Symmetric in appearance.   Palpation: No lymphadenopathy. No masses. No thyromegaly. Salivary glands soft.    Neurological Exam:  Normal speech. Facial nerve function intact.      ASSESSMENT AND PLAN  No diagnosis found.     Ricky Ellison is a 29 y.o. male who presented for follow-up of right anterior nasal septal chemical cautery.  I advised the patient to use a nasal saline gel in his nose to keep his nose moist and gave him a sample of this today. He should also continue with nasal saline irrigation.   I invited and answered all questions to the patient's satisfaction   He will follow up as needed.    Kathlene CoteJoseph B Vella, MD,PHD  04/12/2015  12:42 PM     I saw this patient with  Dr. Curlene LabrumVella and personally reviewed and confirmed the history, physical examination, electronic chart notes.  I agree with the plan as outlined.    With  best personal regards,      Laddie Aquas, M.D.  Associate Professor  Otology/Neurotology  Department of Otolaryngology    Dictated with voice recognition software, not read.

## 2015-04-14 ENCOUNTER — Telehealth: Payer: Self-pay | Admitting: Primary Care

## 2015-04-14 NOTE — Telephone Encounter (Signed)
OK with me.  Makes sense to keep families together.

## 2015-04-14 NOTE — Telephone Encounter (Signed)
Pt was originally assigned to Dr Michael LitterYosha, as only seen Ricky Ellison.  Wife has seen [facility]. For NPV.  Pt would like to switch to [facility] if possible.  Please advise

## 2015-04-14 NOTE — Telephone Encounter (Signed)
Aboslutely.  No problem.  Ricky CivilASSAF [name], MD

## 2015-04-19 NOTE — Telephone Encounter (Signed)
Please inform pt that he we can switch his pcp to [facility].  When informing pt please change banner and reschedule his phy with [facility]

## 2015-05-04 NOTE — Telephone Encounter (Signed)
Pt switched to Dr Kristine Lineaent and appt rescheduled.

## 2015-05-12 ENCOUNTER — Ambulatory Visit: Payer: Self-pay | Admitting: Primary Care

## 2015-06-08 ENCOUNTER — Other Ambulatory Visit: Payer: Self-pay | Admitting: Primary Care

## 2015-06-08 DIAGNOSIS — J45909 Unspecified asthma, uncomplicated: Secondary | ICD-10-CM

## 2015-06-08 NOTE — Telephone Encounter (Signed)
Please clarify pharmacy [facility]

## 2015-06-09 MED ORDER — ALBUTEROL SULFATE HFA 108 (90 BASE) MCG/ACT IN AERS *I*
2.0000 | INHALATION_SPRAY | Freq: Four times a day (QID) | RESPIRATORY_TRACT | 5 refills | Status: DC | PRN
Start: 2015-06-09 — End: 2016-05-14

## 2015-06-24 ENCOUNTER — Ambulatory Visit: Payer: Self-pay | Admitting: Pediatrics

## 2015-07-14 ENCOUNTER — Ambulatory Visit: Payer: Self-pay | Admitting: Pediatric Allergy

## 2015-07-14 ENCOUNTER — Ambulatory Visit: Payer: Self-pay

## 2015-07-14 ENCOUNTER — Encounter: Payer: Self-pay | Admitting: Pediatric Allergy

## 2015-07-14 VITALS — BP 130/64 | HR 79 | Temp 97.0°F | Ht 75.0 in | Wt 274.0 lb

## 2015-07-14 DIAGNOSIS — T783XXD Angioneurotic edema, subsequent encounter: Secondary | ICD-10-CM

## 2015-07-14 DIAGNOSIS — T783XXA Angioneurotic edema, initial encounter: Secondary | ICD-10-CM | POA: Insufficient documentation

## 2015-07-14 DIAGNOSIS — J301 Allergic rhinitis due to pollen: Secondary | ICD-10-CM

## 2015-07-14 DIAGNOSIS — J4520 Mild intermittent asthma, uncomplicated: Secondary | ICD-10-CM

## 2015-07-14 DIAGNOSIS — L509 Urticaria, unspecified: Secondary | ICD-10-CM | POA: Insufficient documentation

## 2015-07-14 NOTE — Progress Notes (Signed)
As you know Gina Costilla is a 29 y.o. male who has a  has a past medical history of Asthma and Fatty liver.Marland Kitchen He presents today for follow up evaluation of   1. Angioedema, subsequent encounter    2. Allergic rhinitis due to pollen    3. Mild intermittent asthma without complication    .    Chief Complaint   Patient presents with    Urticaria    Allergic Rhinitis     Asthma       HPI:  Romeo Apple notes that he had one episode of tongue swelling (small amount on the outside left edge) when he was outside on the back porch this summer and he was able to get control of his symptoms with benadryl. He notes that he started feeling that his tongue was puffy. He had not ingested anything including alcohol. It only took about 10 minutes for symptoms to resolve. He gets itchy in his eyes and under his arms and neck with these episodes. He did not have any hives or breathing difficulty with this. No episodes of hives outside of this. No swelling outside of when he is outside. Skin tested positive to trees, grasses, weeds, dust mite back in April 2016.  He has the Epi Pen and has not had to use it.   He has been taking Allegra at least 1 per day some days he takes 2. He does feel that this has helped.    He has not been doing the nose spray as one of the side effects is nose bleeds and his ENT recommended against it.   He does feel that he has some post nasal drip. He had caughtery of his nose and he has not had any nose bleeds since that time.   He is using saline mist or AYR saline gel.   He has already had the dust mite control measures in place.     He feels that his asthma was doing well.   He did have to use his inhaler during exercise.   No nocturnal symptoms. He usually only worsens in the winter months.    Asthma ACT Scores - Review 07/14/2015   In the past 4 weeks, how much of the time did your asthma keep you from getting as much done at work, school, or home? 5-None of the time   During the past 4 weeks, how often have  you had shortness of breath? 5-Not at all   During the past 4 weeks, how often did your asthma symptoms wake you up at night or earlier than usual in the morning? 5-not at all   During the past 4 weeks, how often have you used your rescue inhaler or nebulizer medication(such as albuterol)? 5-not at all   How would you rate your asthma control during the past 4 weeks? 4-Well controlled   ACT SCORE 12 and OLDER 24     Patient's medications, allergies, past medical, surgical, social and family histories were reviewed and updated as appropriate.    There is no change in the patients family or personal history. The patient's medications and allergies have been reviewed and updated as appropriate.    Social History  He  reports that he has never smoked. He has never used smokeless tobacco. He reports that he drinks alcohol. He reports that he does not use illicit drugs.     Environmental History  No change in environmental history.     Review of Systems  Constitutional: Negative for chills and fever.   HENT: Negative for nosebleeds and sore throat.    Eyes: Negative for blurred vision and double vision.   Respiratory: Negative for cough, shortness of breath and wheezing.    Cardiovascular: Negative for chest pain, palpitations and leg swelling.   Gastrointestinal: Negative for abdominal pain, blood in stool, diarrhea, nausea and vomiting.   Musculoskeletal: Negative for joint pain and myalgias.   Skin: Negative for itching and rash.   Neurological: Negative for dizziness, focal weakness, weakness and headaches.   Endo/Heme/Allergies: Does not bruise/bleed easily.         Physical Exam   Constitutional: He is oriented to person, place, and time and well-developed, well-nourished, and in no distress. No distress.   HENT:   Head: Normocephalic and atraumatic.   Right Ear: Tympanic membrane, external ear and ear canal normal. No drainage or tenderness. Tympanic membrane is not injected, not scarred, not perforated, not  erythematous and not retracted.   Left Ear: Tympanic membrane, external ear and ear canal normal. No drainage or tenderness. Tympanic membrane is not injected, not scarred, not perforated, not erythematous and not retracted.   Nose: Mucosal edema (pale boggy nasal mucosa without obstruction or nasal polyps. erythema of the left anterior nares. no blood. ) and rhinorrhea (clear) present.   Mouth/Throat: Uvula is midline, oropharynx is clear and moist and mucous membranes are normal.   Eyes: Conjunctivae and EOM are normal. Pupils are equal, round, and reactive to light.   Neck: Normal range of motion. Neck supple.   Cardiovascular: Normal rate and regular rhythm.    No murmur heard.  Pulmonary/Chest: Effort normal and breath sounds normal. He has no wheezes. He has no rales.   Abdominal: Soft. Bowel sounds are normal.   Musculoskeletal: Normal range of motion. He exhibits no edema.   Neurological: He is alert and oriented to person, place, and time.   Skin: Skin is warm and dry. No rash noted. He is not diaphoretic. No erythema.   Psychiatric: Affect normal.   Vitals reviewed.    Spirometry was performed for evaluation of shortness of breath and safety of performing skin prick testing.      Actual %Pred   FEV1   4.41 86   FVC   5.89 92   FEV1/FVC Ratio: 75%  FEF25-75  3.56 72   Interpretation: Spirometric lung function values without evidence of obstruction.The inspiratory limb of flow volume loop does demonstrate evidence suggestive of extrathoracic airway obstruction.       Assessment and Plan:   1. Angioedema, subsequent encounter     2. Allergic rhinitis due to pollen     3. Mild intermittent asthma without complication         1. Angioedema and Urticaria:    - This is from allergy to the pollens, more commonly seen in children but some adults who have significant sensitivity can have this as well. His history of symptoms only with exposure to the outdoors and only during the summer months is consistent with  this. I have recommended that he try taking allegra twice daily during the summer months. He can continue to use benadryl for breakthrough symptoms. Due to the location and severity of his symptoms I've recommended he keep auto-injectable epinephrine readily available and use as needed for severe allergic reaction.  - Consider allergy shots as an option if this continues to recur or progresses    2. Asthma: well controlled, lung function testing looks good  today. No medication changes today. He will continue to use albuterol 2 puffs with a spacer every 4-6 hours as needed for shortness of breath, wheezing, cough    3. Allergic Rhinitis: Mostly well controlled.  - continue once daily antihistamine  - if this is not effective we can consider an antihistamine nose spray or allergy shots.       Revanth Neidig is scheduled to return to the [facility] in 12 months for follow-up evaluation.      Mr. Kennard Fildes has received take home instructions and/or educational material today.     Thank you for the opportunity to participate in the care of your delightful patient, Mr. Kiran Carline.  Should there be any questions or concerns, please do not hesitate to contact me.    Sincerely,    Antony Salmon. Christia Reading, MD, MS  Assistant Professor of Medicine and Pediatrics  Allergy and Immunology  Division of Allergy, Immunology, and Rheumatology  [facility]

## 2015-07-14 NOTE — Patient Instructions (Addendum)
1. Swelling/Hives:   - This is from allergy to the pollens  - Take allegra twice daily during the summer months.   - Keep auto-injectable epinephrine readily available and use as needed for severe allergic reaction.  - Consider allergy shots as an option    2. Asthma: well controlled no medication changes today    3. Allergic Rhinitis:   - continue once daily antihistamine  - if this is not effective we can consider an antihistamine nose spray    Follow up in 1 yr

## 2015-08-12 ENCOUNTER — Telehealth: Payer: Self-pay | Admitting: Pediatrics

## 2015-08-12 DIAGNOSIS — Z139 Encounter for screening, unspecified: Secondary | ICD-10-CM

## 2015-08-12 NOTE — Telephone Encounter (Signed)
Left message for pt informing him that only vaccine history in chart is Tdap and Hep A.  Asked him to call office back to find out what titers he is needing.

## 2015-08-12 NOTE — Telephone Encounter (Signed)
Patient states he needs a copy of his Immunizations. Patient states he is not sure if we have all Immunization so he can come in or can Dr.Dent order Tiers blood work. Please call [phone]. Thank You.

## 2015-08-12 NOTE — Telephone Encounter (Signed)
Dr Annabell Sabal ok to order mmr titers?

## 2015-08-12 NOTE — Telephone Encounter (Signed)
OK to order.  Signed.

## 2015-08-12 NOTE — Telephone Encounter (Signed)
Patient called and stated he needs the titers for the MMR.  He would like all of the Titers done so there is a record of his immunizations.  Please advise if this can be done.  Thank you.

## 2015-08-12 NOTE — Telephone Encounter (Signed)
Left message informing pt that titers have been ordered.

## 2015-08-13 ENCOUNTER — Ambulatory Visit
Admission: RE | Admit: 2015-08-13 | Discharge: 2015-08-13 | Disposition: A | Discharge status: Home / Self Care | Payer: Self-pay | Source: Ambulatory Visit | Attending: Pediatrics | Admitting: Pediatrics

## 2015-08-13 DIAGNOSIS — K760 Fatty (change of) liver, not elsewhere classified: Secondary | ICD-10-CM

## 2015-08-13 DIAGNOSIS — Z139 Encounter for screening, unspecified: Secondary | ICD-10-CM

## 2015-08-13 LAB — HEPATIC FUNCTION PANEL
ALT: 25 U/L (ref 0–50)
AST: 19 U/L (ref 0–50)
Albumin: 4.6 g/dL (ref 3.5–5.2)
Alk Phos: 85 U/L (ref 40–130)
Bilirubin,Direct: <0.2 mg/dL (ref 0.0–0.3)
Bilirubin,Total: 0.5 mg/dL (ref 0.0–1.2)
Total Protein: 7.1 g/dL (ref 6.3–7.7)

## 2015-08-14 LAB — MEASLES IGG AB: Measles IgG: NEGATIVE

## 2015-08-14 LAB — RUBELLA ANTIBODY, IGG: Rubella IgG AB: NEGATIVE

## 2015-08-14 LAB — MUMPS ANTIBODY, IGG: Mumps IgG: NEGATIVE

## 2015-08-17 ENCOUNTER — Ambulatory Visit: Payer: Self-pay

## 2015-08-17 ENCOUNTER — Encounter: Payer: Self-pay | Admitting: Gastroenterology

## 2015-08-17 DIAGNOSIS — Z23 Encounter for immunization: Secondary | ICD-10-CM

## 2015-08-17 NOTE — Progress Notes (Signed)
Pt presents today for the first of two MMR vaccine .Offered no complaints ,Tolerated vaccine .  Pt will need second dose and has scheduled appt 09/14/15.

## 2015-08-23 ENCOUNTER — Ambulatory Visit: Payer: Self-pay | Admitting: Orthopedic Surgery

## 2015-08-23 ENCOUNTER — Encounter: Payer: Self-pay | Admitting: Orthopedic Surgery

## 2015-08-23 VITALS — BP 127/67 | HR 72 | Ht 75.0 in | Wt 270.8 lb

## 2015-08-23 DIAGNOSIS — M778 Other enthesopathies, not elsewhere classified: Secondary | ICD-10-CM

## 2015-08-23 NOTE — Patient Instructions (Signed)
Dear Ricky Ellison,    Your physician has determined that you require durable medical equipment (DME) as a part of your treatment.  Knee braces, cast boots, walking boots, crutches, etc. Are DME.  These items offer protection and provide for your safety.  The type and quality of DME has been prescribed for you by your provider.      We cannot determine how much of the cost of this product will be paid by your insurance carrier.  Therefore, you may receive a bill for the outstanding balance.  It is your responsibility to pay whatever fee your insurance carrier does not.    DME Return Policy:     Braces and boots are not returnable if work outside of clinic due to hygiene concerns.   Poorly fitting braces can be exchanged for a correct fit within 1 week if the DME is in excellent condition.   DME that was not dispensed by [facility] will not be accepted.    If DME must be returned, it must be returned to the office that dispensed it.   Special order braces are billed at the time of order and are non-refundable.   Brace parts can be ordered and replaced if they become damaged or worn out.  This may include a charge.    Your type of brace: Other - 10 Inch Lace-up Wrist Brace, Large, Left.    Patient Signature: __________________________  08/23/2015

## 2015-08-23 NOTE — Progress Notes (Signed)
History of Present Illness: Ricky Ellison is a 29 year old right-hand-dominant male who comes in today with regards to left wrist pain.  He reports the onset over the last 24-48 hours.  He was participating in an exercise routine which included a lot of Burpee's and pushups.  He does not recall a specific event when the symptoms started.  The next day on the 17th he developed pain over the dorsal ulnar aspect of the wrist.  He is had pain with motion to the wrist.  He tried to go to work today and do some typing but was unable to do so.  He has been resting it, icing, compression and using anti-inflammatory medication.  Prior to this no troubles with the wrist.  No elbow pain.    Information supplemental to this note including Past Medical/Surgical History, Medications, Allergies, Social History, Family History, Review of Systems is recorded in the annotated patient questionnaire of this date, and located in the patient's chart.    Physical Exam: Patient is 6 foot 3, 270 pounds, BMI 33.85    In general pleasant male appearing stated age.    Left wrist: There is no swelling.  No ecchymosis.  No deformity.  He does have some tenderness over the dorsum of the wrist near the extensor carpi ulnaris.  He has no tenderness over the distal radius or the snuffbox.  Nontender over the volar aspect of the wrist.  He can gently move the wrist in all planes but he has pain with resisted wrist extension.  He has minimal discomfort with resisted wrist flexion.  He tolerates radial, ulnar deviation well.  Thumb, digits move well.  Elbow moves well.  Neurovascularly intact.    Imaging: X-rays of the left wrist obtained in office.  No acute findings.  Good bone mineralization.    Assessment: Left wrist extensor tendinitis.  An overuse type injury.  Perhaps some element of sprain.    Plan: Findings presented to the patient.  He is fitted and placed into a 10 inch wrist brace.  He can take this off for gentle motion, hygiene.  He is  encouraged to begin ibuprofen 800 mg 3 times daily.  Cardiovascular and lower extremity exercises okay but he should refrain from upper extremity exercises.  Follow-up in 3 weeks' time to review.  He will call with concerns otherwise.

## 2015-09-14 ENCOUNTER — Ambulatory Visit: Payer: Self-pay

## 2015-09-14 ENCOUNTER — Ambulatory Visit: Payer: Self-pay | Admitting: Orthopedic Surgery

## 2015-09-14 DIAGNOSIS — Z23 Encounter for immunization: Secondary | ICD-10-CM

## 2015-09-14 NOTE — Progress Notes (Signed)
Pt presents today for second mmr vaccine ,Tolerated well ,offered no complaints.

## 2015-09-30 ENCOUNTER — Ambulatory Visit: Payer: Self-pay | Admitting: Pediatrics

## 2015-09-30 ENCOUNTER — Encounter: Payer: Self-pay | Admitting: Pediatrics

## 2015-09-30 VITALS — BP 106/72 | HR 96 | Ht 75.0 in | Wt 268.0 lb

## 2015-09-30 DIAGNOSIS — Z23 Encounter for immunization: Secondary | ICD-10-CM

## 2015-09-30 DIAGNOSIS — Z0000 Encounter for general adult medical examination without abnormal findings: Secondary | ICD-10-CM

## 2015-09-30 NOTE — Progress Notes (Signed)
Health Maintenance Visit, Male Aged 31-49 Years    Ricky Ellison is a 29 y.o. male here for a HCM Visit    Follow-up issues: none    Acute issues/concerns: none    Medications, allergies, medical, surgical, family, and social history reviewed today    Social History     Social History    Marital status: Married     Spouse name: N/A    Number of children: N/A    Years of education: N/A     Social History Main Topics    Smoking status: Never Smoker    Smokeless tobacco: Never Used    Alcohol use 0.0 oz/week     0 Standard drinks or equivalent per week      Comment: occasional    Drug use: No    Sexual activity: Yes     Partners: Female     Pharmacist, hospital protection: Condom     Other Topics Concern    None     Social History Narrative    Lives with:  Lives with wife    Lives in:  [place]    Origin/immigration: [employer] [place]    Hobbies: hiking, Financial trader    Exercise: 6 days a week goes to gym    Diet: no special diet    Travel:  no    Occupation:  Designer, industrial/product at [employer]    Years of Education: BA          Marital Status: married   Living situation: With spouse    Activity/nutrition: physically active exercises regularly    Healthy diet: Yes    Depression (PHQ2)   Often been bothered by feeling down, depressed or hopeless: 0   Often been bothered by little interest or pleasure in doing things: 0    Alcohol and Drug Use (CAGE-AID)   Tried to Cut down?  No     Annoyed by Criticism?  No     Felt Guilty?  No     Had an Eye opener?  No      PHYSICAL EXAM  Blood pressure 106/72, pulse 96, height 1.905 m ( ), weight 121.6 kg (268 lb).  Body mass index is 33.5 kg/(m^2).  General Appearance:    Alert, cooperative, no distress, appears stated age   Head:    Normocephalic   Eyes:    Conjunctiva clear and without pallor, sclerae anicteric   Oropharynx:   Buccal mucosa normal; good dentition; oropharynx clear   Neck:   Supple, no thyromegaly   Lungs:     Clear to auscultation bilaterally without W/R/R,  unlabored respiratory effort    Heart:    Regular rate and rhythm, S1 and S2 normal, no murmur, rub or gallop   Abdomen:     Soft, non-tender, no masses, no hepatosplenomegaly   Extremities:   Warm and well perfused, legs w/o edema  GU:  Normal testes / scrotum   Musculoskeletal:   Palms, fingers, and nails normal to inspection   Skin:   Warm, dry, good turgor, no rashes in visible areas   Lymph nodes:   Cervical and supraclavicular nodes normal   Neurologic:   A&O x 3, normal gait   Psychiatric:   Normal mood and affect     ASSESSMENT & PLAN  Routine Well Adult Visit    Screening checklist (see orders)   Syphilis screen (USPSTF recommends if at increased risk) - not indicated   Urine Chlamydia and GC DNA  amplification testing (consider if at increased risk) - not indicated   HIV test ([place] law requires all > 13 to be offered HIV testing at least once) - not indicated   Lipid screening  (USPSTF recommends at age 31) - done in past (due to weight)   Diabetes screening (USPSTF recommends screening in adults aged 40-70 years who are overweight or obese) - neg in past and weight is down    Counseling/Anticipatory Guidance   Nutritional and exercise counseling provided   STI counseling provided (if increased risk)   Counseled about risks of skin cancer and importance of sunscreen protection (USPSTF recommends ages 5910-24)   Advance directives discussed and HCP form given    Immunizations/Prophylaxis   The vaccination status of the following vaccines were addressed at today's visit:   Hep A #2 due.  Uptodate otherwise.     Orders Placed This Encounter   Procedures    Hepatitis A vaccine adult IM        Next periodic health supervision visit due in 1-2 years.    Jackquline Denmarkichard Scott Vora Clover, MD

## 2015-10-08 ENCOUNTER — Ambulatory Visit
Admission: AD | Admit: 2015-10-08 | Discharge: 2015-10-08 | Disposition: A | Discharge status: Home / Self Care | Payer: Self-pay

## 2015-10-08 DIAGNOSIS — L03012 Cellulitis of left finger: Secondary | ICD-10-CM

## 2015-10-08 HISTORY — DX: Other seasonal allergic rhinitis: J30.2

## 2015-10-08 LAB — HM HIV SCREENING OFFERED

## 2015-10-08 MED ORDER — SULFAMETHOXAZOLE-TRIMETHOPRIM 800-160 MG PO TABS *I*
1.0000 | ORAL_TABLET | Freq: Two times a day (BID) | ORAL | 0 refills | Status: AC
Start: 2015-10-08 — End: 2015-10-18

## 2015-10-08 NOTE — ED Procedure Documentation (Signed)
Procedures   Incision and drainage  Date/Time: 10/08/2015 11:00 AM  Performed by: Selina CooleyOURTNEY, Charmeka Freeburg  Authorized by: Selina CooleyOURTNEY, Jaculin Rasmus   Consent: Verbal consent obtained.  Risks and benefits: risks, benefits and alternatives were discussed  Consent given by: patient  Patient understanding: patient states understanding of the procedure being performed  Patient consent: the patient's understanding of the procedure matches consent given  Site marked: the operative site was marked  Required items: required blood products, implants, devices, and special equipment available  Patient identity confirmed: verbally with patient  Time out: Immediately prior to procedure a "time out" was called to verify the correct patient, procedure, equipment, support staff and site/side marked as required.  Type: abscess  Body area: upper extremity  Location details: left long finger  Anesthesia: local infiltration    Anesthesia:  Anesthesia: local infiltration  Local Anesthetic: lidocaine 1% without epinephrine   Sedation:  Patient sedated: no    Scalpel size: 10  Incision type: single straight  Drainage: purulent  Drainage amount: moderate  Wound treatment: wound left open  Packing material: none  Patient tolerance: Patient tolerated the procedure well with no immediate complications  Comments: Patient was advised to soak the finger 2-3 times a day for approximately 20 minutes.  He should monitor signs and symptoms of deep placed on and about a Septra DS she'll take twice a day for the next 10 days.  He should follow-up with the primary Dr. with any concerns.  Patient tolerated the procedure well          Selina CooleyKathleen Ethin Drummond, NP     Selina Cooleyourtney, Mikeria Valin, NP  10/08/15 1101

## 2015-10-08 NOTE — UC Provider Note (Signed)
History     Chief Complaint   Patient presents with    Hand Pain     c/o pain c swelling to L long finger x 4 days, denies any drainage, states increased redness c swelling x 2 days.     HPI Comments: Patient is a 29 year old male who presents to the urgent care with swelling to the left long finger 4 days.      History provided by:  Patient  Language interpreter used: No    Is this ED visit related to civilian activity for income:  Not work related      Past Medical History   Diagnosis Date    Asthma      exercise    Fatty liver     Seasonal allergies             Past Surgical History   Procedure Laterality Date    Wisdom tooth extraction         Family History   Problem Relation Age of Onset    Diabetes Mother     Allergies Mother     Diabetes Father     Hypertension Father     Allergies Father     Diabetes Paternal Grandmother     Heart attack Paternal Grandmother     Colon cancer Paternal Grandfather     Cancer Paternal Grandfather     Prostate cancer Maternal Grandfather     Cancer Maternal Grandfather     Diabetes Maternal Grandmother     Breast cancer Maternal Grandmother     Cancer Maternal Grandmother      lymphoma    No Known Problems Brother          Social History    reports that he has never smoked. He has never used smokeless tobacco. He reports that he drinks alcohol. He reports that he currently engages in sexual activity and has had male partners. He reports using the following method of birth control/protection: Condom. He reports that he does not use illicit drugs.    Living Situation     Questions Responses    Patient lives with Spouse    Homeless No    Caregiver for other family member No    External Services None    Employment Employed    Domestic Violence Risk           Review of Systems   Review of Systems   Constitutional: Negative for chills and fever.   HENT: Negative for dental problem, drooling, facial swelling, hearing loss, sneezing, sore throat and trouble  swallowing.    Eyes: Negative for pain, discharge and redness.   Respiratory: Negative for apnea, cough, shortness of breath, wheezing and stridor.    Cardiovascular: Negative for chest pain.   Gastrointestinal: Negative for abdominal distention, blood in stool, constipation, diarrhea, nausea and vomiting.   Endocrine: Negative for cold intolerance.   Genitourinary: Negative for difficulty urinating and frequency.   Musculoskeletal: Positive for joint swelling (left middle finger PIP joint and above is red and swollen and tender to the touch his discharge around the base of the nail that was purulent.). Negative for back pain, neck pain and neck stiffness.   Skin: Negative for color change and rash.   Allergic/Immunologic: Negative for environmental allergies.   Neurological: Negative for dizziness, seizures, speech difficulty, weakness, numbness and headaches.   Psychiatric/Behavioral: Negative for agitation and sleep disturbance. The patient is not nervous/anxious.    All other systems reviewed  and are negative.      Physical Exam     ED Triage Vitals   BP Pulse Heart Rate (via Pulse Ox) Resp Temp Temp src SpO2 O2 Device O2 Flow Rate   10/08/15 0958 -- 10/08/15 0958 10/08/15 0958 10/08/15 0958 10/08/15 0958 10/08/15 0958 10/08/15 0958 --   127/79  90 16 36.9 C (98.5 F) TEMPORAL 100 % None (Room air)       Weight           10/08/15 0958           120.2 kg (265 lb)               Physical Exam   Constitutional: He is oriented to person, place, and time. He appears well-developed and well-nourished.   HENT:   Head: Normocephalic and atraumatic.   Right Ear: External ear normal.   Left Ear: External ear normal.   Nose: Nose normal.   Mouth/Throat: Oropharynx is clear and moist. No oropharyngeal exudate.   Eyes: Conjunctivae and EOM are normal. Pupils are equal, round, and reactive to light. Right eye exhibits no discharge. Left eye exhibits no discharge.   Neck: Normal range of motion. Neck supple. No JVD present.    Cardiovascular: Normal rate, regular rhythm, normal heart sounds and intact distal pulses.    Pulmonary/Chest: Effort normal and breath sounds normal. No stridor. No respiratory distress. He has no wheezes.   Abdominal: Soft. Bowel sounds are normal. He exhibits no distension and no mass. There is no tenderness. There is no rebound and no guarding.   Musculoskeletal: He exhibits edema and tenderness.   Left little finger from the DIP joint up is red swollen tender to the touch with inflammation noted.  It's warm to the touch there is also slight discharge around the nail bed itself.   Lymphadenopathy:     He has no cervical adenopathy.   Neurological: He is alert and oriented to person, place, and time. He has normal reflexes. He displays normal reflexes. He exhibits normal muscle tone. Coordination normal.   Skin: Skin is warm.   Psychiatric: He has a normal mood and affect. His behavior is normal. Judgment and thought content normal.   Nursing note and vitals reviewed.       Medical Decision Making        Initial Evaluation:  ED First Provider Contact     None          Patient seen by me as above    Assessment:  29 y.o.male comes [facility] with swelling of the DIP and up on the left middle finger.  Patient states that he noticed some soreness and swelling around the edge of the finger looked like an ingrown nail he picked at it and tried to squeeze it to remove some pus.  He was unsuccessful the finger got more inflamed and red pain has increased over the last 4 days.    Differential Diagnosis includes   Paronychia left middle finger                Plan: Finger was soaked in Betadine and saline for approximately 15 minutes.  See procedural note patient was advised to leave the finger covered and keep it clean and dry for the next 24 hours after which he can take the dressing off wash it with soap and water he needs to be started on Ativan he will be given Septra DS to take  twice a day for the  next 10 days.  He should monitor the signs and symptoms and soak his finger frequently after the first 24 hours.  He should soak at least 4 times a day for approximately 20 minutes.  He should not pick at that and should not squeeze it.  He should follow-up with his primary Dr. if the symptoms worsen or persist more than 10 days.    Final Diagnosis  Final diagnoses:   None           Selina Cooley, NP    Supervising physician Dr Veneta Penton was immediately available     Selina Cooley, NP  10/08/15 1056

## 2015-10-08 NOTE — Discharge Instructions (Signed)
Patient was advised to leave the finger covered until tonight he can soak it after tonight 3-4 times a day in warm water for approximately 20 minutes he should monitor signs and symptoms.  He'll be started on a course of Septra DS she'll take that twice a day for the next 10 days.  He'll take ibuprofen or Tylenol as needed for any pain or fever.

## 2016-01-03 ENCOUNTER — Encounter: Payer: Self-pay | Admitting: Otolaryngology

## 2016-01-05 ENCOUNTER — Ambulatory Visit: Payer: Self-pay | Admitting: Otolaryngology

## 2016-01-05 ENCOUNTER — Encounter: Payer: Self-pay | Admitting: Otolaryngology

## 2016-01-05 VITALS — BP 143/75 | HR 78 | Ht 75.0 in | Wt 270.0 lb

## 2016-01-05 DIAGNOSIS — R040 Epistaxis: Secondary | ICD-10-CM

## 2016-01-05 NOTE — Progress Notes (Signed)
Patient seen on 01/05/16 at 2:47 PM     HPI  CC: Follow-up from cauterization of right nasal septum  Treatment to date: right cauterization    Context: Ricky Ellison is a 30 y.o. male who returns for nosebleeds that have been going on and off for many years. He reports that they occur mostly in the spring and fall which is when he has seasonal allergies. He underwent silaver nitrate cautery on the right in April and since has had not further bleeding from this site.  However he has had significant bleeding from the left side recently.  He continues to use nasal saline irrigations BID at home.     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    HISTORY  Patient's past medical and surgical history, medications, allergies, and social history were reviewed. Pertinent history has been included in the HPI.    REVIEW OF SYSTEMS  Review of Systems   Constitutional: Negative for fever and chills.   HENT: Positive for congestion and nosebleeds. Negative for ear discharge, ear pain, hearing loss and tinnitus.    Eyes: Negative.    Respiratory: Negative for cough and shortness of breath.    Cardiovascular: Negative.    Gastrointestinal: Negative for nausea and vomiting.   Genitourinary: Negative.    Musculoskeletal: Negative.    Skin: Negative for itching and rash.   Neurological: Negative.    Endo/Heme/Allergies: Negative.          PHYSICAL EXAM  Vitals:    01/05/16 1433   BP: 143/75   Pulse: 78   Weight: 122.5 kg (270 lb)   Height: 190.5 cm ( )        Constitutional Exam: He is pleasant and cooperative.  Well-developed and well-nourished.  No apparent distress.  Vital signs reviewed in electronic record.    Exam of Ears   Inspection:  Right: Normal pinna. Normal canal. Healthy, intact TM. Left: Normal pinna. Normal canal. Healthy, intact TM.     Palpation: No mastoid tenderness.     Exam of Nose:   Inspection: Septum fairly midline. Normal turbinates. No polyps or lesions. Area of mild  desiccation and erythema near previous right cautery site, no prominent vessels on right septum.  Prominent vessel note on the left anterior septum.  After topical application of tetracaine and phenylephrine, area was cauteries using silver nitrate.  Bacitracin ointment coated fibrillar was placed over cautery wound.    Exam of Oral Cavity/Oropharynx:   Inspection: Normal dental wear for age. Normal appearing mucosal surfaces. Tongue midline. No lesions. Normal uvula and soft palate. Uvula midline. Normal oropharynx.     Exam of Neck:   Inspection: No gross masses. Supple. Symmetric in appearance.   Palpation: No lymphadenopathy. No masses. No thyromegaly. Salivary glands soft.    Neurological Exam:  Normal speech. Facial nerve function intact.      ASSESSMENT AND PLAN  No diagnosis found.     Ricky Ellison is a 30 y.o. male who presented for left anterior nasal septal chemical cautery.   Patient tolerated procedure well.   He will follow up as needed.    Kathlene Cote, MD,PHD  01/05/2016  2:47 PM   Procedure Note: Nasal Cautery  After placement of topical vasoconstrictive and anesthetic pledgets, the anterior nasal cavity was thoroughly examined.  Prominent superficial hypervascular vessels in [place] area at the anterior nasal septum on the left were cauterized with the use of  silver nitrate.  Topical emollients were applied.  The patient tolerated the procedure well.  Instructions were reviewed regarding nasal humidification to help alleviate further episodes.    I saw this patient with  Dr. Curlene Labrum and personally reviewed and confirmed the history, physical examination, electronic chart notes.  I agree with the plan as outlined.    With best personal regards,      Emilio Aspen, M.D.  Associate Professor  Otology/Neurotology  Department of Otolaryngology    Dictated with voice recognition software, not read.

## 2016-05-14 ENCOUNTER — Other Ambulatory Visit: Payer: Self-pay | Admitting: Pediatric Allergy

## 2016-05-14 ENCOUNTER — Other Ambulatory Visit: Payer: Self-pay | Admitting: Pediatrics

## 2016-05-14 DIAGNOSIS — J45909 Unspecified asthma, uncomplicated: Secondary | ICD-10-CM

## 2016-05-14 DIAGNOSIS — Z9109 Other allergy status, other than to drugs and biological substances: Secondary | ICD-10-CM

## 2016-05-15 ENCOUNTER — Other Ambulatory Visit: Payer: Self-pay | Admitting: Pediatric Allergy

## 2016-05-15 DIAGNOSIS — Z9109 Other allergy status, other than to drugs and biological substances: Secondary | ICD-10-CM

## 2016-05-15 MED ORDER — EPINEPHRINE 0.3 MG/0.3ML IJ SOAJ *I*
0.3000 mg | INTRAMUSCULAR | 6 refills | Status: AC | PRN
Start: 2016-05-15 — End: ?

## 2016-05-15 MED ORDER — ALBUTEROL SULFATE HFA 108 (90 BASE) MCG/ACT IN AERS *I*
2.0000 | INHALATION_SPRAY | Freq: Four times a day (QID) | RESPIRATORY_TRACT | 5 refills | Status: AC | PRN
Start: 2016-05-15 — End: ?

## 2016-09-29 ENCOUNTER — Ambulatory Visit: Payer: Self-pay | Admitting: Pediatrics

## 2019-07-21 ENCOUNTER — Emergency Department: Payer: 59

## 2019-07-21 ENCOUNTER — Encounter: Payer: Self-pay | Admitting: Emergency Medicine

## 2019-07-21 ENCOUNTER — Emergency Department
Admission: EM | Admit: 2019-07-21 | Discharge: 2019-07-21 | Disposition: A | Discharge status: Home / Self Care | Payer: 59 | Attending: Emergency Medicine | Admitting: Emergency Medicine

## 2019-07-21 ENCOUNTER — Other Ambulatory Visit: Payer: Self-pay

## 2019-07-21 DIAGNOSIS — R0789 Other chest pain: Secondary | ICD-10-CM | POA: Insufficient documentation

## 2019-07-21 DIAGNOSIS — R079 Chest pain, unspecified: Secondary | ICD-10-CM | POA: Diagnosis present

## 2019-07-21 LAB — CBC
HCT: 45.2 % (ref 39.0–52.0)
Hemoglobin: 15.6 g/dL (ref 13.0–17.0)
MCH: 29.8 pg (ref 26.0–34.0)
MCHC: 34.5 g/dL (ref 30.0–36.0)
MCV: 86.4 fL (ref 80.0–100.0)
Platelets: 250 10*3/uL (ref 150–400)
RBC: 5.23 MIL/uL (ref 4.22–5.81)
RDW: 12.4 % (ref 11.5–15.5)
WBC: 10 10*3/uL (ref 4.0–10.5)
nRBC: 0 % (ref 0.0–0.2)

## 2019-07-21 LAB — BASIC METABOLIC PANEL
Anion gap: 9 (ref 5–15)
BUN: 16 mg/dL (ref 6–20)
CO2: 25 mmol/L (ref 22–32)
Calcium: 9.3 mg/dL (ref 8.9–10.3)
Chloride: 101 mmol/L (ref 98–111)
Creatinine, Ser: 1.05 mg/dL (ref 0.61–1.24)
GFR calc Af Amer: >60 mL/min (ref >60)
GFR calc non Af Amer: >60 mL/min (ref >60)
Glucose, Bld: 128 mg/dL — ABNORMAL HIGH (ref 70–99)
Potassium: 3.3 mmol/L — ABNORMAL LOW (ref 3.5–5.1)
Sodium: 135 mmol/L (ref 135–145)

## 2019-07-21 LAB — TROPONIN I (HIGH SENSITIVITY)
Troponin I (High Sensitivity): <2 ng/L (ref <18)
Troponin I (High Sensitivity): <2 ng/L (ref <18)

## 2019-07-21 NOTE — ED Triage Notes (Signed)
Here for mid sternal chest pain that started while playing on floor with daughter.  Pt reports pain resolved but feels like heart has been racing.  No hx of same. No medical hx.  VSS. No fever or cough.

## 2019-07-21 NOTE — ED Provider Notes (Signed)
[facility] Emergency Department Provider Note   ____________________________________________    I have reviewed the triage vital signs and the nursing notes.   HISTORY  Chief Complaint Chest Pain     HPI Adrian Esparza is a 33 y.o. male who presents with complaints of chest pain.  Patient reports that approximately 5 PM he was playing on the floor with his 31-month-old and he developed a brief episode of tightness in his chest, after resolved he felt like his heart was racing and when he checked his pulse it was 130.  Currently feels well and no longer has any chest pain.  No shortness of breath.  No pleurisy.  No nausea vomiting or diaphoresis.  No cardiac death at young age in his family  History reviewed. No pertinent past medical history.  There are no active problems to display for this patient.   History reviewed. No pertinent surgical history.  Prior to Admission medications   Not on File     Allergies Patient has no known allergies.  History reviewed. No pertinent family history.  Social History Social History   Tobacco Use  . Smoking status: Never Smoker  . Smokeless tobacco: Never Used  Substance Use Topics  . Alcohol use: Yes    Frequency: Never  . Drug use: Never    Review of Systems  Constitutional: No fever/chills Eyes: No visual changes.  ENT: No sore throat. Cardiovascular: As above Respiratory: Denies shortness of breath. Gastrointestinal: No abdominal pain.  No nausea, no vomiting.   Genitourinary: Negative for dysuria. Musculoskeletal: Negative for back pain. Skin: Negative for rash. Neurological: Negative for headaches    ____________________________________________   PHYSICAL EXAM:  VITAL SIGNS: ED Triage Vitals [07/21/19 1817]  Enc Vitals Group     BP 119/78     Pulse Rate (!) 110     Resp 18     Temp 98.8 F (37.1 C)     Temp Source Oral     SpO2 98 %     Weight 127 kg (280 lb)     Height  1.93 m (6\' 4" )     Head Circumference      Peak Flow      Pain Score 0     Pain Loc      Pain Edu?      Excl. in [place]?     Constitutional: Alert and oriented.  Eyes: Conjunctivae are normal.   Nose: No congestion/rhinnorhea. Mouth/Throat: Mucous membranes are moist.    Cardiovascular: Normal rate, regular rhythm. Grossly normal heart sounds.  Good peripheral circulation. Respiratory: Normal respiratory effort.  No retractions. Lungs CTAB. Gastrointestinal: Soft and nontender. No distention.  No CVA tenderness.  Musculoskeletal: No lower extremity tenderness nor edema.  Warm and well perfused Neurologic:  Normal speech and language. No gross focal neurologic deficits are appreciated.  Skin:  Skin is warm, dry and intact. No rash noted. Psychiatric: Mood and affect are normal. Speech and behavior are normal.  ____________________________________________   LABS (all labs ordered are listed, but only abnormal results are displayed)  Labs Reviewed  BASIC METABOLIC PANEL - Abnormal; Notable for the following components:      Result Value   Potassium 3.3 (*)    Glucose, Bld 128 (*)    All other components within normal limits  CBC  TROPONIN I (HIGH SENSITIVITY)  TROPONIN I (HIGH SENSITIVITY)   ____________________________________________  EKG  ED ECG REPORT I, Lavonia Drafts, the attending physician, personally  viewed and interpreted this ECG.  Date: 07/21/2019  Rhythm: Sinus tachycardia QRS Axis: normal Intervals: normal ST/T Wave abnormalities: normal Narrative Interpretation: no evidence of acute ischemia  ____________________________________________  RADIOLOGY  Chest x-ray normal ____________________________________________   PROCEDURES  Procedure(s) performed: No  Procedures   Critical Care performed: No ____________________________________________   INITIAL IMPRESSION / ASSESSMENT AND PLAN / ED COURSE  Pertinent labs & imaging results that were  available during my care of the patient were reviewed by me and considered in my medical decision making (see chart for details).  Patient presents after brief episode of chest discomfort followed by tachycardia.  EKG is quite reassuring.  Initial labs are unremarkable.  Undetectable troponin.  Chest x-ray is benign.  Exam is reassuring as well.  PERC negative.  Second troponin sent  Second troponin is again undetectable.  Patient feels well on reexam has no complaints.  Appropriate for discharge at this time with close follow-up with cardiology, strict return precautions discussed patient agrees with this plan.    ____________________________________________   FINAL CLINICAL IMPRESSION(S) / ED DIAGNOSES  Final diagnoses:  Atypical chest pain        Note:  This document was prepared using Dragon voice recognition software and may include unintentional dictation errors.   Jene EveryKinner, Calil Amor, MD 07/21/19 2100

## 2019-08-25 DIAGNOSIS — R079 Chest pain, unspecified: Secondary | ICD-10-CM | POA: Insufficient documentation

## 2019-08-25 NOTE — Progress Notes (Signed)
Cardiology Office Note  Date:  08/26/2019   ID:  Adrian Esparza, DOB 04/29/1986, MRN [id number]  PCP:  Patient, No Pcp Per   Chief Complaint  Patient presents with  . New Patient (Initial Visit)    Self referral. Patienty c/o HR in the 140s with chest discomfort. Meds reviewed verbally with patient.     HPI:  33 year old male with history of Obesity recent presentation to the emergency room July 21, 2019 with tachycardia Who presents for evaluation of recent chest pain, tachycardia  In August 2020 reported that he was playing on the floor with his 3353-month-old  Acutely developed  tightness in his chest, following which he developed tachycardia heart rate up to 130 bpm Denied any chest pain or shortness of breath with the tachycardia  He went to the emergency room, heart rate on EKG 101 bpm Potassium 3.3 Cardiac enzymes negative x2  Felt fine since ER 07/21/2019  Denies having any problems with exertion, reports having good exercise tolerance  No prior cardiac history  EKG personally reviewed by myself on todays visit Shows sinus tachycardia rate 115 bpm no significant ST-T wave changes  PMH:   has no past medical history on file.  PSH:   History reviewed. No pertinent surgical history.  Current Outpatient Medications  Medication Sig Dispense Refill  . albuterol (VENTOLIN HFA) 108 (90 Base) MCG/ACT inhaler Inhale into the lungs.     No current facility-administered medications for this visit.     Allergies:   Patient has no known allergies.   Social History:  The patient  reports that he has never smoked. He has never used smokeless tobacco. He reports current alcohol use. He reports that he does not use drugs.   Family History:   family history includes Hyperlipidemia in his father; Hypertension in his father.    Review of Systems: Review of Systems  Constitutional: Negative.   HENT: Negative.   Respiratory: Negative.   Cardiovascular: Positive for chest pain  and palpitations.  Gastrointestinal: Negative.   Musculoskeletal: Negative.   Neurological: Negative.   Psychiatric/Behavioral: Negative.   All other systems reviewed and are negative.   PHYSICAL EXAM: VS:  BP 110/80 (BP Location: Right Arm, Patient Position: Sitting, Cuff Size: Normal)   Pulse (!) 115   Ht 6\' 4"  (1.93 m)   Wt 300 lb (136.1 kg)   BMI 36.52 kg/m  , BMI Body mass index is 36.52 kg/m. GEN: Well nourished, well developed, in no acute distress HEENT: normal Neck: no JVD, carotid bruits, or masses Cardiac: RRR; no murmurs, rubs, or gallops,no edema  Respiratory:  clear to auscultation bilaterally, normal work of breathing GI: soft, nontender, nondistended, + BS MS: no deformity or atrophy Skin: warm and dry, no rash Neuro:  Strength and sensation are intact Psych: euthymic mood, full affect   Recent Labs: 07/21/2019: BUN 16; Creatinine, Ser 1.05; Hemoglobin 15.6; Platelets 250; Potassium 3.3; Sodium 135    Lipid Panel No results found for: CHOL, HDL, LDLCALC, TRIG    Wt Readings from Last 3 Encounters:  08/26/19 300 lb (136.1 kg)  07/21/19 280 lb (127 kg)       ASSESSMENT AND PLAN:  Problem List Items Addressed This Visit    Chest pain of uncertain etiology   Relevant Orders   EKG 12-Lead    Other Visit Diagnoses    Paroxysmal tachycardia (HCC)    -  Primary   Relevant Medications   propranolol (INDERAL) 20 MG tablet  Paroxysmal tachycardia Unable to exclude atrial tachycardia as a cause of his acute tachycardia with rate up to 140 bpm He is in sinus tachycardia today asymptomatic likely of little clinical significance, likely situational -Otherwise normal EKG, normal clinical exam Recommend he start monitoring heart rate at home Zio monitor was offered if he notices tachycardia at home -We have also provided propranolol 20 mg to be taken as needed for any recurrent paroxysmal tachycardia We did suggest if he has recurrent tachycardia we  check a thyroid  Chest discomfort Atypical in nature, otherwise has good exercise tolerance No ischemic work-up needed at this time Recommended if he would like a lipid panel he call our office to have this arranged  Disposition:   F/U as needed   Total encounter time more than 45 minutes  Greater than 50% was spent in counseling and coordination of care with the patient    Signed, Esmond Plants, M.D., Ph.D. [place], [place]

## 2019-08-26 ENCOUNTER — Other Ambulatory Visit: Payer: Self-pay

## 2019-08-26 ENCOUNTER — Ambulatory Visit (INDEPENDENT_AMBULATORY_CARE_PROVIDER_SITE_OTHER): Payer: 59 | Admitting: Cardiovascular Disease

## 2019-08-26 ENCOUNTER — Encounter: Payer: Self-pay | Admitting: Cardiovascular Disease

## 2019-08-26 VITALS — BP 110/80 | HR 115 | Ht 76.0 in | Wt 300.0 lb

## 2019-08-26 DIAGNOSIS — R0789 Other chest pain: Secondary | ICD-10-CM | POA: Diagnosis not present

## 2019-08-26 DIAGNOSIS — I479 Paroxysmal tachycardia, unspecified: Secondary | ICD-10-CM | POA: Diagnosis not present

## 2019-08-26 DIAGNOSIS — R079 Chest pain, unspecified: Secondary | ICD-10-CM

## 2019-08-26 MED ORDER — PROPRANOLOL HCL 20 MG PO TABS: 20.0000 mg | ORAL_TABLET | Freq: Three times a day (TID) | ORAL | 2 refills | Status: AC | PRN

## 2019-08-26 NOTE — Patient Instructions (Addendum)
Medication Instructions:  Please take propranolol 20 mg as needed for tachycardia Wait one hour before second dose Script: 20 mg TID PRN [employer] [address]  If you need a refill on your cardiac medications before your next appointment, please call your pharmacy.    Lab work: No new labs needed   If you have labs (blood work) drawn today and your tests are completely normal, you will receive your results only by: Marland Kitchen MyChart Message (if you have MyChart) OR . A paper copy in the mail If you have any lab test that is abnormal or we need to change your treatment, we will call you to review the results.   Testing/Procedures: No new testing needed   Follow-Up: At [facility], you and your health needs are our priority.  As part of our continuing mission to provide you with exceptional heart care, we have created designated Provider Care Teams.  These Care Teams include your primary Cardiologist (physician) and Advanced Practice Providers (APPs -  Physician Assistants and Nurse Practitioners) who all work together to provide you with the care you need, when you need it.  . You will need a follow up appointment as needed  . Providers on your designated Care Team:   . Murray Hodgkins, NP . Christell Faith, PA-C . Marrianne Mood, PA-C  Any Other Special Instructions Will Be Listed Below (If Applicable).  For educational health videos Log in to : [email or web address] Or : [email or web address], password : [email or web address]

## 2019-11-24 ENCOUNTER — Ambulatory Visit: Payer: Managed Care, Other (non HMO) | Attending: Internal Medicine

## 2019-11-24 DIAGNOSIS — Z20822 Contact with and (suspected) exposure to covid-19: Secondary | ICD-10-CM

## 2019-11-25 LAB — NOVEL CORONAVIRUS, NAA: SARS-CoV-2, NAA: NOT DETECTED

## 2021-08-07 IMAGING — CR CHEST - 2 VIEW
1 series · 2 of 2 positions shown · non-contrast
Comparison: None.

CLINICAL DATA: Chest pain

EXAM:
CHEST - 2 VIEW

[Series 1: dg chest 2 view · 0.14mm/px · 2 of 2 slices shown]
[im 1/2]
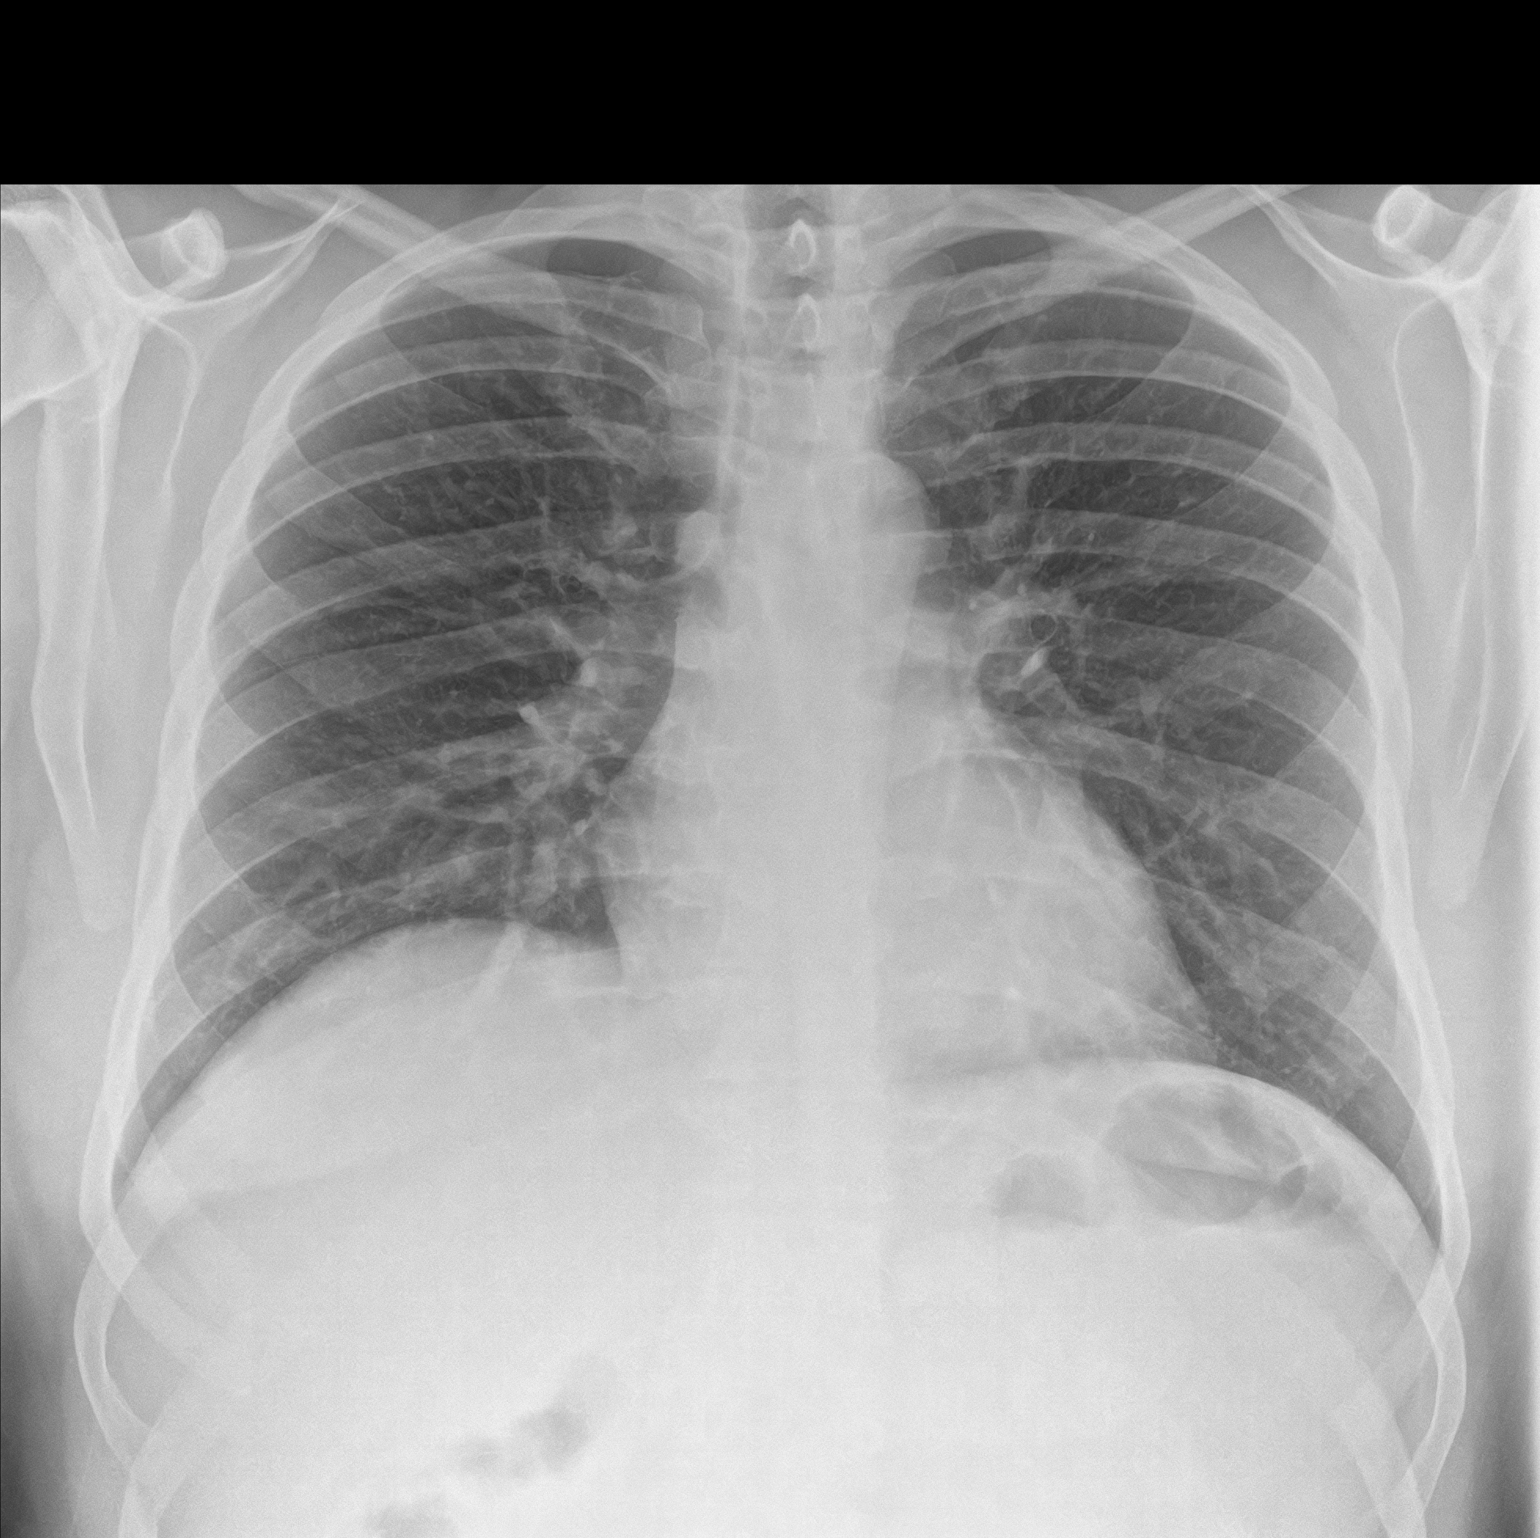
[im 2/2]
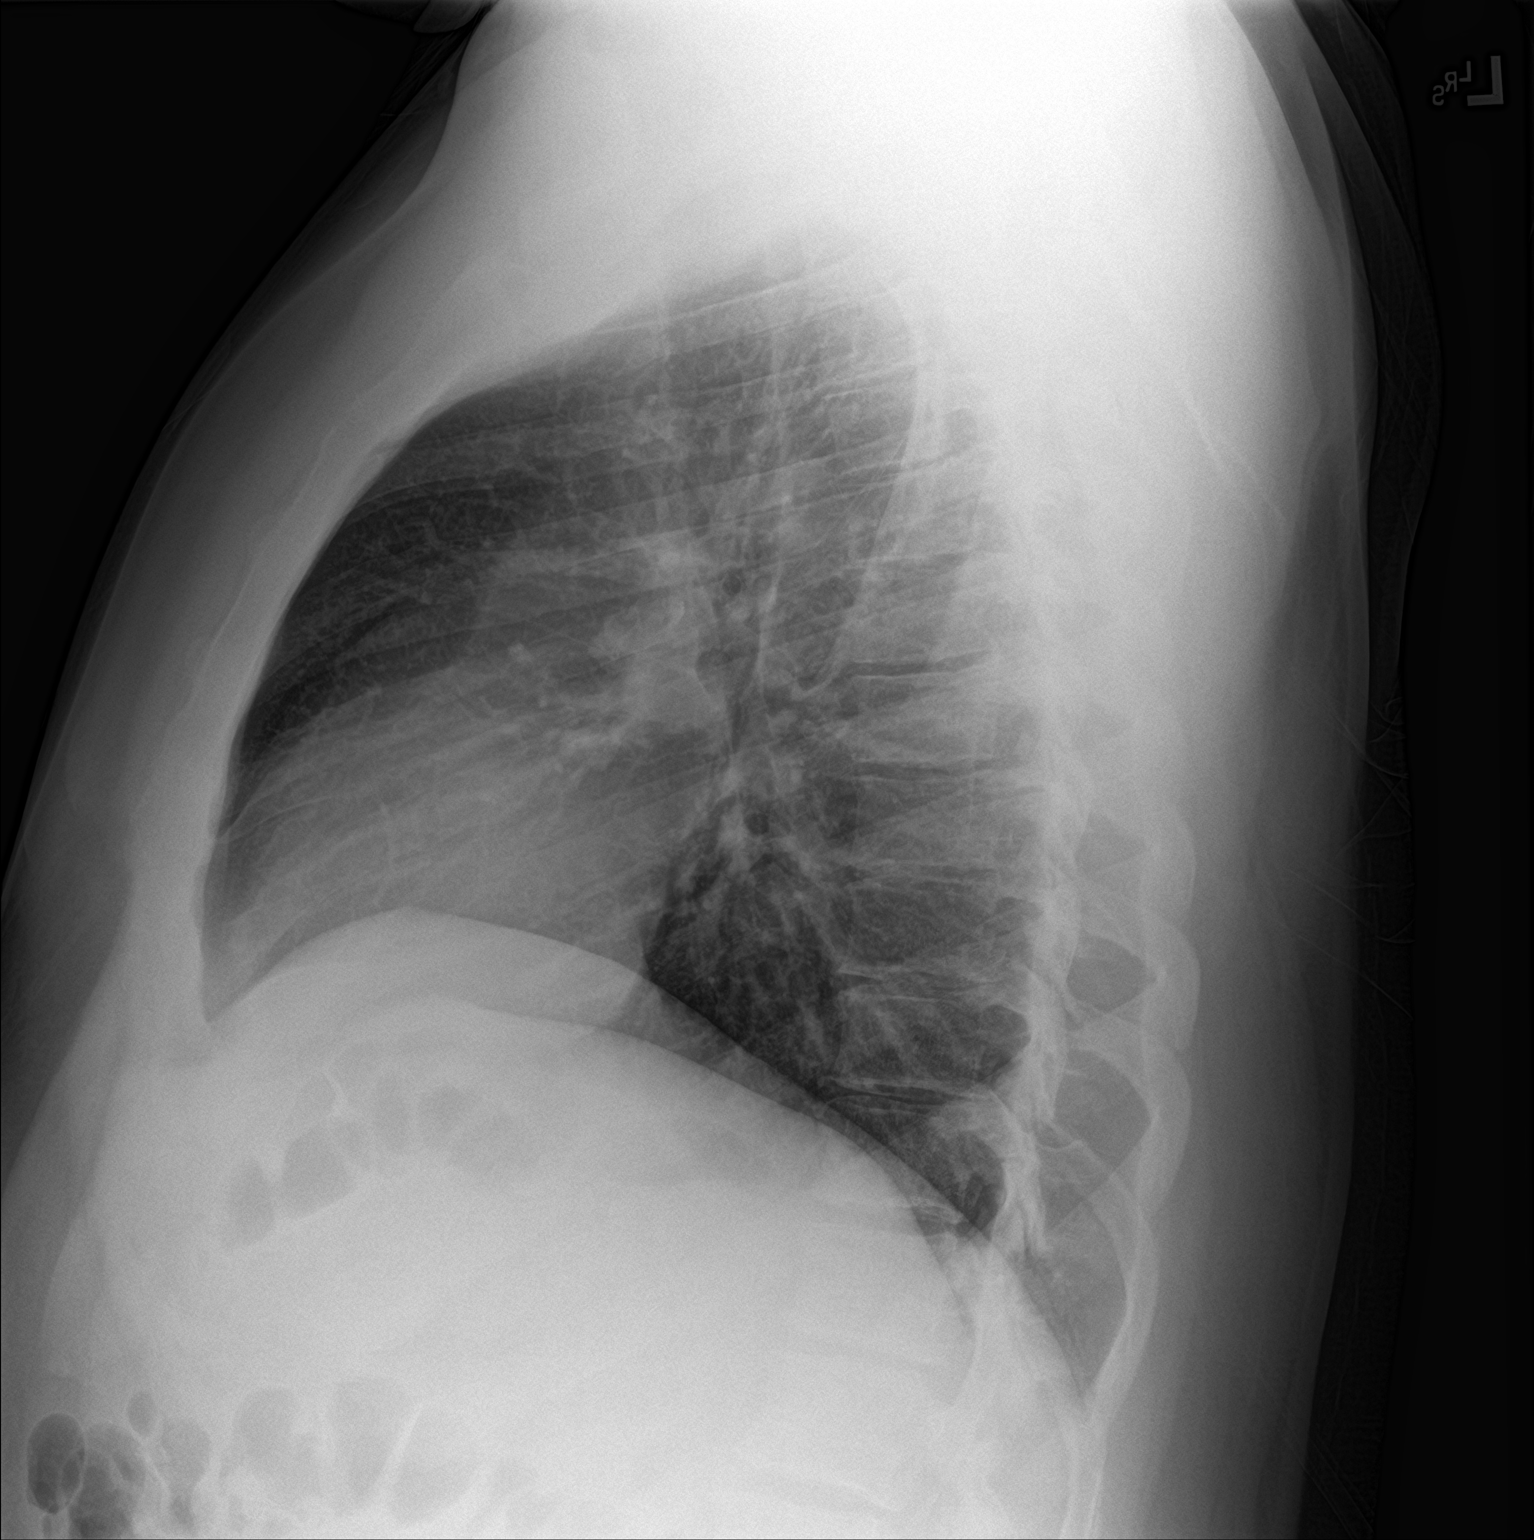

[2 of 2 positions shown; findings below may reference images not displayed]

FINDINGS: The heart size and mediastinal contours are within normal limits.
Both lungs are clear. The visualized skeletal structures are
unremarkable.
IMPRESSION: No active cardiopulmonary disease.

## 7562-04-03 DEATH — deceased
